# Patient Record
Sex: Male | Born: 1960 | Race: White | Hispanic: No | Smoking: Former smoker
Health system: Southern US, Community
[De-identification: ages and names within clinical notes are randomized; demographics above are authoritative.]

## PROBLEM LIST (undated history)

## (undated) DIAGNOSIS — I10 Essential (primary) hypertension: Secondary | ICD-10-CM

## (undated) DIAGNOSIS — G4730 Sleep apnea, unspecified: Secondary | ICD-10-CM

## (undated) DIAGNOSIS — E785 Hyperlipidemia, unspecified: Secondary | ICD-10-CM

## (undated) DIAGNOSIS — E118 Type 2 diabetes mellitus with unspecified complications: Secondary | ICD-10-CM

## (undated) HISTORY — PX: TONSILLECTOMY: SUR1361

## (undated) HISTORY — DX: Hyperlipidemia, unspecified: E78.5

## (undated) HISTORY — DX: Sleep apnea, unspecified: G47.30

## (undated) HISTORY — DX: Type 2 diabetes mellitus with unspecified complications: E11.8

## (undated) HISTORY — PX: HERNIA REPAIR: SHX51

---

## 2015-09-20 ENCOUNTER — Encounter: Payer: Self-pay | Admitting: Family Medicine

## 2015-09-20 ENCOUNTER — Ambulatory Visit (INDEPENDENT_AMBULATORY_CARE_PROVIDER_SITE_OTHER): Payer: TRICARE For Life (TFL) | Admitting: Family Medicine

## 2015-09-20 VITALS — BP 136/84 | HR 68 | Temp 98.3°F | Resp 18 | Ht 71.0 in | Wt 244.0 lb

## 2015-09-20 DIAGNOSIS — Z0000 Encounter for general adult medical examination without abnormal findings: Secondary | ICD-10-CM

## 2015-09-20 DIAGNOSIS — Z1159 Encounter for screening for other viral diseases: Secondary | ICD-10-CM

## 2015-09-20 DIAGNOSIS — Z7189 Other specified counseling: Secondary | ICD-10-CM

## 2015-09-20 DIAGNOSIS — Z7689 Persons encountering health services in other specified circumstances: Secondary | ICD-10-CM

## 2015-09-20 DIAGNOSIS — Z1211 Encounter for screening for malignant neoplasm of colon: Secondary | ICD-10-CM

## 2015-09-20 NOTE — Progress Notes (Signed)
Subjective:    Patient ID: Willie Coleman, male    DOB: 1960/10/10, 55 y.o.   MRN: [id number]  HPI Since here today to establish care. He has no medical concerns. He is due for rectal exam, PSA, colonoscopy, and hepatitis C screening. Otherwise he is doing well with no concerns. He has been exercising and has lost 18 pounds over last few months. He is trying to lose even more. He is checking his blood pressure at home. His blood pressure tends to run between 120 and 130/70-80. His review of systems is negative. No past medical history on file. Past Surgical History:  Procedure Laterality Date  . HERNIA REPAIR     inguinal  . TONSILLECTOMY     No current outpatient prescriptions on file prior to visit.   No current facility-administered medications on file prior to visit.    No Known Allergies Social History   Social History  . Marital status: Divorced    Spouse name: N/A  . Number of children: N/A  . Years of education: N/A   Occupational History  . Not on file.   Social History Main Topics  . Smoking status: Former Smoker    Years: 30.00    Types: Cigarettes    Quit date: 09/19/2012  . Smokeless tobacco: Never Used  . Alcohol use Yes     Comment: 2 drinks per week  . Drug use: No  . Sexual activity: Not on file     Comment: adopted   Other Topics Concern  . Not on file   Social History Narrative  . No narrative on file   No family history on file. Family history is unknown. He is adopted and has no knowledge of his biologic parents   Review of Systems  All other systems reviewed and are negative.      Objective:   Physical Exam  Constitutional: He is oriented to person, place, and time. He appears well-developed and well-nourished. No distress.  HENT:  Head: Normocephalic and atraumatic.  Right Ear: External ear normal.  Left Ear: External ear normal.  Nose: Nose normal.  Mouth/Throat: Oropharynx is clear and moist. No oropharyngeal exudate.  Eyes:  Conjunctivae and EOM are normal. Pupils are equal, round, and reactive to light. Right eye exhibits no discharge. Left eye exhibits no discharge. No scleral icterus.  Neck: Normal range of motion. Neck supple. No JVD present. No tracheal deviation present. No thyromegaly present.  Cardiovascular: Normal rate, regular rhythm, normal heart sounds and intact distal pulses.  Exam reveals no gallop and no friction rub.   No murmur heard. Pulmonary/Chest: Effort normal and breath sounds normal. No stridor. No respiratory distress. He has no wheezes. He has no rales. He exhibits no tenderness.  Abdominal: Soft. Bowel sounds are normal. He exhibits no distension. There is no tenderness. There is no rebound and no guarding.  Genitourinary: Rectum normal and prostate normal.  Musculoskeletal: Normal range of motion. He exhibits no edema, tenderness or deformity.  Lymphadenopathy:    He has no cervical adenopathy.  Neurological: He is alert and oriented to person, place, and time. He has normal reflexes. He displays normal reflexes. No cranial nerve deficit. He exhibits normal muscle tone. Coordination normal.  Skin: Skin is warm. No rash noted. He is not diaphoretic. No erythema. No pallor.  Psychiatric: He has a normal mood and affect. His behavior is normal. Judgment and thought content normal.  Vitals reviewed.         Assessment &  Plan:  Encounter to establish care with new doctor  Routine general medical examination at a health care facility - Plan: CBC with Differential/Platelet, COMPLETE METABOLIC PANEL WITH GFR, Lipid panel, PSA, Hepatitis C Ab Reflex HCV RNA, QUANT  Need for hepatitis C screening test - Plan: Hepatitis C Ab Reflex HCV RNA, QUANT  Colon cancer screening - Plan: Ambulatory referral to Gastroenterology  Medical exam is normal. I'll have the patient return fasting for fasting lab work including a CBC CMP fasting lipid panel and a PSA along with hepatitis C screening. I'll  schedule him for colonoscopy. Tetanus shots up-to-date. The remainder of his preventative care is up-to-date

## 2015-09-26 ENCOUNTER — Other Ambulatory Visit: Payer: TRICARE For Life (TFL)

## 2015-09-26 LAB — CBC WITH DIFFERENTIAL/PLATELET
BASOS ABS: 0 {cells}/uL (ref 0–200)
BASOS PCT: 0 %
EOS ABS: 434 {cells}/uL (ref 15–500)
Eosinophils Relative: 7 %
HCT: 40.8 % (ref 38.5–50.0)
HEMOGLOBIN: 14.3 g/dL (ref 13.0–17.0)
Lymphocytes Relative: 46 %
Lymphs Abs: 2852 cells/uL (ref 850–3900)
MCH: 32.2 pg (ref 27.0–33.0)
MCHC: 35 g/dL (ref 32.0–36.0)
MCV: 91.9 fL (ref 80.0–100.0)
MONO ABS: 372 {cells}/uL (ref 200–950)
MONOS PCT: 6 %
MPV: 9.8 fL (ref 7.5–12.5)
NEUTROS ABS: 2542 {cells}/uL (ref 1500–7800)
Neutrophils Relative %: 41 %
Platelets: 206 10*3/uL (ref 140–400)
RBC: 4.44 MIL/uL (ref 4.20–5.80)
RDW: 11.9 % (ref 11.0–15.0)
WBC: 6.2 10*3/uL (ref 3.8–10.8)

## 2015-09-26 LAB — LIPID PANEL
Cholesterol: 157 mg/dL (ref 125–200)
HDL: 38 mg/dL — ABNORMAL LOW (ref >=40)
LDL CALC: 103 mg/dL (ref <130)
Total CHOL/HDL Ratio: 4.1 Ratio (ref <=5.0)
Triglycerides: 78 mg/dL (ref <150)
VLDL: 16 mg/dL (ref <30)

## 2015-09-26 LAB — COMPLETE METABOLIC PANEL WITH GFR
ALBUMIN: 4.2 g/dL (ref 3.6–5.1)
ALK PHOS: 58 U/L (ref 40–115)
ALT: 25 U/L (ref 9–46)
AST: 21 U/L (ref 10–35)
BILIRUBIN TOTAL: 0.4 mg/dL (ref 0.2–1.2)
BUN: 22 mg/dL (ref 7–25)
CO2: 26 mmol/L (ref 20–31)
Calcium: 9.4 mg/dL (ref 8.6–10.3)
Chloride: 105 mmol/L (ref 98–110)
Creat: 1.22 mg/dL (ref 0.70–1.33)
GFR, Est African American: 78 mL/min (ref >=60)
GFR, Est Non African American: 67 mL/min (ref >=60)
GLUCOSE: 107 mg/dL — AB (ref 70–99)
Potassium: 4.6 mmol/L (ref 3.5–5.3)
SODIUM: 139 mmol/L (ref 135–146)
TOTAL PROTEIN: 6.6 g/dL (ref 6.1–8.1)

## 2015-09-26 LAB — PSA: PSA: 1.1 ng/mL (ref <=4.0)

## 2015-09-26 LAB — HEPATITIS C ANTIBODY: HCV AB: NEGATIVE

## 2015-09-27 ENCOUNTER — Encounter: Payer: Self-pay | Admitting: *Deleted

## 2015-09-28 ENCOUNTER — Encounter (INDEPENDENT_AMBULATORY_CARE_PROVIDER_SITE_OTHER): Payer: Self-pay | Admitting: *Deleted

## 2015-10-11 ENCOUNTER — Encounter: Payer: Self-pay | Admitting: Family Medicine

## 2015-10-11 ENCOUNTER — Ambulatory Visit (INDEPENDENT_AMBULATORY_CARE_PROVIDER_SITE_OTHER): Payer: TRICARE For Life (TFL) | Admitting: Family Medicine

## 2015-10-11 VITALS — BP 132/78 | HR 76 | Temp 97.7°F | Resp 16 | Ht 71.0 in | Wt 243.0 lb

## 2015-10-11 DIAGNOSIS — F418 Other specified anxiety disorders: Secondary | ICD-10-CM

## 2015-10-11 MED ORDER — PROPRANOLOL HCL 10 MG PO TABS: 10.0000 mg | ORAL_TABLET | Freq: Three times a day (TID) | ORAL | 0 refills | Status: DC

## 2015-10-11 NOTE — Progress Notes (Signed)
   Subjective:    Patient ID: Willie Coleman, male    DOB: [birth year], 55 y.o.   MRN: [id number]  HPI  He has situational anxiety. Whenever he takes a test, has to give a major presentation, he has increased sympathetic response which is characterized by tachycardia, a hand tremor, his voice trembling. His awareness of the symptoms makes them worse. The symptoms then seemed to spiral out of control and impaired his ability to perform on test and presentations. He is interested in medication to help control this. No past medical history on file. Past Surgical History:  Procedure Laterality Date  . HERNIA REPAIR     inguinal  . TONSILLECTOMY     No current outpatient prescriptions on file prior to visit.   No current facility-administered medications on file prior to visit.    No Known Allergies Social History   Social History  . Marital status: Divorced    Spouse name: N/A  . Number of children: N/A  . Years of education: N/A   Occupational History  . Not on file.   Social History Main Topics  . Smoking status: Former Smoker    Years: 30.00    Types: Cigarettes    Quit date: 09/19/2012  . Smokeless tobacco: Never Used  . Alcohol use Yes     Comment: 2 drinks per week  . Drug use: No  . Sexual activity: Not on file     Comment: adopted   Other Topics Concern  . Not on file   Social History Narrative  . No narrative on file       Review of Systems  All other systems reviewed and are negative.      Objective:   Physical Exam  Constitutional: He is oriented to person, place, and time.  Cardiovascular: Normal rate, regular rhythm and normal heart sounds.   Pulmonary/Chest: Effort normal and breath sounds normal.  Neurological: He is alert and oriented to person, place, and time. He has normal reflexes. He displays normal reflexes. No cranial nerve deficit. He exhibits normal muscle tone. Coordination normal.  Psychiatric: He has a normal mood and affect. His  behavior is normal. Judgment and thought content normal.  Vitals reviewed.         Assessment & Plan:  Situational anxiety - Plan: propranolol (INDERAL) 10 MG tablet  Try propranolol 10 mg 1 by mouth every 8 hours when necessary anxiety. The patient will use it 1 hour prior to test or public speaking. Consider cognitive behavioral therapy/biofeedback if medication is not helpful

## 2015-11-08 ENCOUNTER — Other Ambulatory Visit: Payer: Self-pay | Admitting: Family Medicine

## 2015-11-08 DIAGNOSIS — F418 Other specified anxiety disorders: Secondary | ICD-10-CM

## 2016-05-03 ENCOUNTER — Other Ambulatory Visit (INDEPENDENT_AMBULATORY_CARE_PROVIDER_SITE_OTHER): Payer: Self-pay | Admitting: *Deleted

## 2016-05-03 DIAGNOSIS — Z1211 Encounter for screening for malignant neoplasm of colon: Secondary | ICD-10-CM

## 2016-07-31 ENCOUNTER — Telehealth (INDEPENDENT_AMBULATORY_CARE_PROVIDER_SITE_OTHER): Payer: Self-pay | Admitting: *Deleted

## 2016-07-31 ENCOUNTER — Encounter (INDEPENDENT_AMBULATORY_CARE_PROVIDER_SITE_OTHER): Payer: Self-pay | Admitting: *Deleted

## 2016-07-31 DIAGNOSIS — Z1211 Encounter for screening for malignant neoplasm of colon: Secondary | ICD-10-CM

## 2016-07-31 MED ORDER — PEG 3350-KCL-NA BICARB-NACL 420 G PO SOLR: 4000.0000 mL | Freq: Once | ORAL | 0 refills | Status: AC

## 2016-07-31 NOTE — Telephone Encounter (Signed)
Patient needs trilyte 

## 2016-07-31 NOTE — Addendum Note (Signed)
Addended by: Butch Penny on: 07/31/2016 09:22 AM   Modules accepted: Orders

## 2016-07-31 NOTE — Telephone Encounter (Signed)
Referring MD/PCP: pickard   Procedure: tcs  Reason/Indication: screening  Has patient had this procedure before?  no  If so, when, by whom and where?    Is there a family history of colon cancer?  no  Who?  What age when diagnosed?    Is patient diabetic?   no      Does patient have prosthetic heart valve or mechanical valve?  no  Do you have a pacemaker?  no  Has patient ever had endocarditis? no  Has patient had joint replacement within last 12 months?  no  Does patient tend to be constipated or take laxatives? no  Does patient have a history of alcohol/drug use?  no  Is patient on Coumadin, Plavix and/or Aspirin? no  Medications: none  Allergies: nkda  Medication Adjustment per Dr Laural Golden:   Procedure date & time: 08/29/16 at 830

## 2016-08-02 NOTE — Telephone Encounter (Signed)
agree

## 2016-08-13 ENCOUNTER — Ambulatory Visit (INDEPENDENT_AMBULATORY_CARE_PROVIDER_SITE_OTHER): Payer: TRICARE For Life (TFL)

## 2016-08-13 ENCOUNTER — Ambulatory Visit: Payer: TRICARE For Life (TFL)

## 2016-08-13 ENCOUNTER — Ambulatory Visit (INDEPENDENT_AMBULATORY_CARE_PROVIDER_SITE_OTHER): Payer: TRICARE For Life (TFL) | Admitting: Podiatry

## 2016-08-13 ENCOUNTER — Encounter: Payer: Self-pay | Admitting: Podiatry

## 2016-08-13 DIAGNOSIS — Z79899 Other long term (current) drug therapy: Secondary | ICD-10-CM

## 2016-08-13 DIAGNOSIS — R52 Pain, unspecified: Secondary | ICD-10-CM

## 2016-08-13 DIAGNOSIS — B351 Tinea unguium: Secondary | ICD-10-CM

## 2016-08-13 DIAGNOSIS — M79672 Pain in left foot: Secondary | ICD-10-CM

## 2016-08-13 DIAGNOSIS — M779 Enthesopathy, unspecified: Secondary | ICD-10-CM

## 2016-08-13 LAB — HEPATIC FUNCTION PANEL
ALK PHOS: 70 U/L (ref 40–115)
ALT: 22 U/L (ref 9–46)
AST: 23 U/L (ref 10–35)
Albumin: 4.6 g/dL (ref 3.6–5.1)
BILIRUBIN DIRECT: 0.1 mg/dL (ref <=0.2)
BILIRUBIN INDIRECT: 0.5 mg/dL (ref 0.2–1.2)
Total Bilirubin: 0.6 mg/dL (ref 0.2–1.2)
Total Protein: 7.2 g/dL (ref 6.1–8.1)

## 2016-08-13 LAB — CBC WITH DIFFERENTIAL/PLATELET
BASOS PCT: 1 %
Basophils Absolute: 76 cells/uL (ref 0–200)
EOS ABS: 304 {cells}/uL (ref 15–500)
Eosinophils Relative: 4 %
HEMATOCRIT: 43 % (ref 38.5–50.0)
HEMOGLOBIN: 15.1 g/dL (ref 13.2–17.1)
LYMPHS ABS: 2812 {cells}/uL (ref 850–3900)
LYMPHS PCT: 37 %
MCH: 32.2 pg (ref 27.0–33.0)
MCHC: 35.1 g/dL (ref 32.0–36.0)
MCV: 91.7 fL (ref 80.0–100.0)
MONO ABS: 380 {cells}/uL (ref 200–950)
MPV: 10.3 fL (ref 7.5–12.5)
Monocytes Relative: 5 %
NEUTROS ABS: 4028 {cells}/uL (ref 1500–7800)
Neutrophils Relative %: 53 %
Platelets: 230 10*3/uL (ref 140–400)
RBC: 4.69 MIL/uL (ref 4.20–5.80)
RDW: 12.9 % (ref 11.0–15.0)
WBC: 7.6 10*3/uL (ref 3.8–10.8)

## 2016-08-13 MED ORDER — MELOXICAM 15 MG PO TABS: 15.0000 mg | ORAL_TABLET | Freq: Every day | ORAL | 2 refills | Status: DC

## 2016-08-13 MED ORDER — TERBINAFINE HCL 250 MG PO TABS: 250.0000 mg | ORAL_TABLET | Freq: Every day | ORAL | 0 refills | Status: DC

## 2016-08-13 NOTE — Patient Instructions (Signed)
Peroneal Tendinopathy Rehab Ask your health care provider which exercises are safe for you. Do exercises exactly as told by your health care provider and adjust them as directed. It is normal to feel mild stretching, pulling, tightness, or discomfort as you do these exercises, but you should stop right away if you feel sudden pain or your pain gets worse.Do not begin these exercises until told by your health care provider. Stretching and range of motion exercises These exercises warm up your muscles and joints and improve the movement and flexibility of your ankle. These exercises also help to relieve pain and stiffness. Exercise A: Gastroc and soleus, standing 1. Stand on the edge of a step on the balls of your feet. The ball of your foot is on the walking surface, right under your toes. 2. Hold onto the railing for balance. 3. Slowly lift your left / right foot, allowing your body weight to press your left / right heel down over the edge of the step. You should feel a stretch in your left / right calf. 4. Hold this position for __________ seconds. Repeat __________ times with your left / right knee straight and __________ times with your left / right knee bent. Complete this stretch __________ times per day. Strengthening exercises These exercises improve the strength and endurance of your foot and ankle. Endurance is the ability to use your muscles for a long time, even after they get tired. Exercise B: Dorsiflexors  1. Secure a rubber exercise band or tube to an object, like a table leg, that will not move if it is pulled on. 2. Secure the other end of the band around your left / right foot. 3. Sit on the floor, facing the object with your left / right foot extended. The band or tube should be slightly tense when your foot is relaxed. 4. Slowly flex your left / right ankle and toes to bring your foot toward you. 5. Hold this position for __________ seconds. 6. Slowly return your foot to the  starting position. Repeat __________ times. Complete this exercise __________ times per day. Exercise C: Evertors 1. Sit on the floor with your legs straight out in front of you. 2. Loop a rubber exercise or band or tube around the ball of your left / right foot. The ball of your foot is on the walking surface, right under your toes. 3. Hold the ends of the band in your hands, or secure the band to a stable object. 4. Slowly push your foot outward, away from your other leg. 5. Hold this position for __________ seconds. 6. Slowly return your foot to the starting position. Repeat __________ times. Complete this exercise __________ times per day. Exercise D: Standing heel raise ( plantar flexion) 1. Stand with your feet shoulder-width apart with the balls of your feet on a step. The ball of your foot is on the walking surface, right under your toes. 2. Keep your weight spread evenly over the width of your feet while you rise up on your toes. Use a wall or railing to steady yourself, but try not to use it for support. 3. If this exercise is too easy, try these options: ? Shift your weight toward your left / right leg until you feel challenged. ? If told by your health care provider, stand on your left / right leg only. 4. Hold this position for __________ seconds. Repeat __________ times. Complete this exercise __________ times per day. Exercise E: Single leg stand 1.   Without shoes, stand near a railing or in a doorway. You may hold onto the railing or door frame as needed. 2. Stand on your left / right foot. Keep your big toe down on the floor and try to keep your arch lifted. ? Do not roll to the outside of your foot. ? If this exercise is too easy, you can try it with your eyes closed or while standing on a pillow. 3. Hold this position for __________ seconds. Repeat __________ times. Complete this exercise __________ times per day. This information is not intended to replace advice given to  you by your health care provider. Make sure you discuss any questions you have with your health care provider. Document Released: 01/15/2005 Document Revised: 09/22/2015 Document Reviewed: 12/04/2014 Elsevier Interactive Patient Education  2018 [employer]   Terbinafine oral granules What is this medicine? TERBINAFINE (TER bin a feen) is an antifungal medicine. It is used to treat certain kinds of fungal or yeast infections. This medicine may be used for other purposes; ask your health care provider or pharmacist if you have questions. COMMON BRAND NAME(S): Lamisil What should I tell my health care provider before I take this medicine? They need to know if you have any of these conditions: -drink alcoholic beverages -kidney disease -liver disease -an unusual or allergic reaction to Terbinafine, other medicines, foods, dyes, or preservatives -pregnant or trying to get pregnant -breast-feeding How should I use this medicine? Take this medicine by mouth. Follow the directions on the prescription label. Hold packet with cut line on top. Shake packet gently to settle contents. Tear packet open along cut line, or use scissors to cut across line. Carefully pour the entire contents of packet onto a spoonful of a soft food, such as pudding or other soft, non-acidic food such as mashed potatoes (do NOT use applesauce or a fruit-based food). If two packets are required for each dose, you may either sprinkle the content of both packets on one spoonful of non-acidic food, or sprinkle the contents of both packets on two spoonfuls of non-acidic food. Make sure that no granules remain in the packet. Swallow the mxiture of the food and granules without chewing. Take your medicine at regular intervals. Do not take it more often than directed. Take all of your medicine as directed even if you think you are better. Do not skip doses or stop your medicine early. Contact your pediatrician or health care  professional regarding the use of this medicine in children. While this medicine may be prescribed for children as young as 4 years for selected conditions, precautions do apply. Overdosage: If you think you have taken too much of this medicine contact a poison control center or emergency room at once. NOTE: This medicine is only for you. Do not share this medicine with others. What if I miss a dose? If you miss a dose, take it as soon as you can. If it is almost time for your next dose, take only that dose. Do not take double or extra doses. What may interact with this medicine? Do not take this medicine with any of the following medications: -thioridazine This medicine may also interact with the following medications: -beta-blockers -caffeine -cimetidine -cyclosporine -MAOIs like Carbex, Eldepryl, Marplan, Nardil, and Parnate -medicines for fungal infections like fluconazole and ketoconazole -medicines for irregular heartbeat like amiodarone, flecainide and propafenone -rifampin -SSRIs like citalopram, escitalopram, fluoxetine, fluvoxamine, paroxetine and sertraline -tricyclic antidepressants like amitriptyline, clomipramine, desipramine, imipramine, nortriptyline, and others -warfarin This   list may not describe all possible interactions. Give your health care provider a list of all the medicines, herbs, non-prescription drugs, or dietary supplements you use. Also tell them if you smoke, drink alcohol, or use illegal drugs. Some items may interact with your medicine. What should I watch for while using this medicine? Your doctor may monitor your liver function. Tell your doctor right away if you have nausea or vomiting, loss of appetite, stomach pain on your right upper side, yellow skin, dark urine, light stools, or are over tired. You need to take this medicine for 6 weeks or longer to cure the fungal infection. Take your medicine regularly for as long as your doctor or health care  professional tells you to. What side effects may I notice from receiving this medicine? Side effects that you should report to your doctor or health care professional as soon as possible: -allergic reactions like skin rash or hives, swelling of the face, lips, or tongue -change in vision -dark urine -fever or infection -general ill feeling or flu-like symptoms -light-colored stools -loss of appetite, nausea -redness, blistering, peeling or loosening of the skin, including inside the mouth -right upper belly pain -unusually weak or tired -yellowing of the eyes or skin Side effects that usually do not require medical attention (report to your doctor or health care professional if they continue or are bothersome): -changes in taste -diarrhea -hair loss -muscle or joint pain -stomach upset This list may not describe all possible side effects. Call your doctor for medical advice about side effects. You may report side effects to FDA at 1-800-FDA-1088. Where should I keep my medicine? Keep out of the reach of children. Store at room temperature between 15 and 30 degrees C (59 and 86 degrees F). Throw away any unused medicine after the expiration date. NOTE: This sheet is a summary. It may not cover all possible information. If you have questions about this medicine, talk to your doctor, pharmacist, or health care provider.  2018 Elsevier/Gold Standard (2007-03-28 17:25:48)  

## 2016-08-13 NOTE — Progress Notes (Signed)
   Subjective:    Patient ID: Rachel Bo, male    DOB: 1960/05/18, 56 y.o.   MRN: [id number]  HPI  56 year old male presents the office with concerns of left foot pain. Since he gets pain inside of his left foot he points the fifth metatarsal base where he gets symptoms in ongoing for about 6 months to 1 year. He states the pain is intermittent in nature mostly after running or doing a lot of walking. Better with rest. He said no significant treatment to this. No recent injury or trauma. No numbness or tingling. He states his nails are also discolored and thick. This has been ongoing for several years. No recent treatment. No redness or drainage or any swelling to the toenail sites. He has no other concerns.  Review of Systems  All other systems reviewed and are negative.      Objective:   Physical Exam General: AAO x3, NAD  Dermatological: Nails are hypertrophic, dystrophic, brittle, discolored, elongated 10. No surrounding redness or drainage. No open lesions or pre-ulcerative lesions are identified today.  Vascular: Dorsalis Pedis artery and Posterior Tibial artery pedal pulses are 2/4 bilateral with immedate capillary fill time. Pedal hair growth present.. There is no pain with calf compression, swelling, warmth, erythema.    Neruologic: Grossly intact via light touch bilateral. Vibratory intact via tuning fork bilateral. Protective threshold with Semmes Wienstein monofilament intact to all pedal sites bilateral.   Musculoskeletal: There is tenderness palpation along the course of the peroneal tendon on the insertion of the fifth metatarsal base and left foot. Tendon appears to be intact. There is localized edema is no erythema or increase in warmth. No area pinpoint bony tenderness or pain the vibratory sensation. Muscular strength 5/5 in all groups tested bilateral.  Gait: Unassisted, Nonantalgic.      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 No evidence of acute fracture identified. -Recommended insert for shoes. Discussed custom orthotics. He was molded for them but then later on. At that his insurance does not cover them he wishes to hold off. Stretching of her rehabilitation exercises. Vibratory is needed. -Discussed treatment options for nail fungus. After discussion he elected to proceed with oral Lamisil. Discussed side effects the medication. I went ahead and prescribed a also ordered blood work and he is not to start the blood work until I call the results of the blood work and he verbally understood this.  -Follow-up as scheduled or sooner if needed.  Celesta Gentile, DPM

## 2016-08-14 ENCOUNTER — Telehealth: Payer: Self-pay | Admitting: *Deleted

## 2016-08-14 NOTE — Telephone Encounter (Addendum)
-----   Message from Trula Slade, DPM sent at 08/14/2016  7:19 AM EDT ----- Blood work norma- OK to start Lamisil- please let him know. Thank you. I informed pt of Dr. Leigh Aurora review of results and orders.

## 2016-08-29 ENCOUNTER — Ambulatory Visit (HOSPITAL_COMMUNITY)
Admission: RE | Admit: 2016-08-29 | Discharge: 2016-08-29 | Disposition: A | Discharge status: Home / Self Care | Payer: TRICARE For Life (TFL) | Source: Ambulatory Visit | Attending: Internal Medicine | Admitting: Internal Medicine

## 2016-08-29 ENCOUNTER — Encounter (HOSPITAL_COMMUNITY)
Admission: RE | Disposition: A | Discharge status: Home / Self Care | Payer: Self-pay | Source: Ambulatory Visit | Attending: Internal Medicine

## 2016-08-29 ENCOUNTER — Encounter (HOSPITAL_COMMUNITY): Payer: Self-pay

## 2016-08-29 DIAGNOSIS — F419 Anxiety disorder, unspecified: Secondary | ICD-10-CM | POA: Diagnosis not present

## 2016-08-29 DIAGNOSIS — Z1211 Encounter for screening for malignant neoplasm of colon: Secondary | ICD-10-CM | POA: Insufficient documentation

## 2016-08-29 DIAGNOSIS — Z87891 Personal history of nicotine dependence: Secondary | ICD-10-CM | POA: Insufficient documentation

## 2016-08-29 DIAGNOSIS — Z79899 Other long term (current) drug therapy: Secondary | ICD-10-CM | POA: Diagnosis not present

## 2016-08-29 DIAGNOSIS — D175 Benign lipomatous neoplasm of intra-abdominal organs: Secondary | ICD-10-CM | POA: Insufficient documentation

## 2016-08-29 HISTORY — PX: COLONOSCOPY: SHX5424

## 2016-08-29 SURGERY — COLONOSCOPY
Anesthesia: Moderate Sedation

## 2016-08-29 MED ORDER — MIDAZOLAM HCL 5 MG/5ML IJ SOLN
INTRAMUSCULAR | Status: DC | PRN
  Administered 2016-08-29 (×2): 2 mg via INTRAVENOUS
  Administered 2016-08-29: 1 mg via INTRAVENOUS
  Administered 2016-08-29: 2 mg via INTRAVENOUS

## 2016-08-29 MED ORDER — SODIUM CHLORIDE 0.9 % IV SOLN
INTRAVENOUS | Status: DC
  Administered 2016-08-29: 08:00:00 via INTRAVENOUS

## 2016-08-29 MED ORDER — STERILE WATER FOR IRRIGATION IR SOLN
Status: DC | PRN
  Administered 2016-08-29: 09:00:00

## 2016-08-29 MED ORDER — MEPERIDINE HCL 50 MG/ML IJ SOLN
INTRAMUSCULAR | Status: DC | PRN
  Administered 2016-08-29 (×2): 25 mg via INTRAVENOUS

## 2016-08-29 MED ORDER — MEPERIDINE HCL 50 MG/ML IJ SOLN
INTRAMUSCULAR | Status: AC
  Filled 2016-08-29: qty 1

## 2016-08-29 MED ORDER — MIDAZOLAM HCL 5 MG/5ML IJ SOLN
INTRAMUSCULAR | Status: AC
  Filled 2016-08-29: qty 10

## 2016-08-29 NOTE — Op Note (Signed)
[facility] Patient Name: Willie Coleman Procedure Date: 08/29/2016 8:13 AM MRN: [id number] Date of Birth: Jul 10, 1960 Attending MD: Hildred Laser , MD CSN: [id number] Age: 56 Admit Type: Outpatient Procedure:                Colonoscopy Indications:              Screening for colorectal malignant neoplasm Providers:                Hildred Laser, MD, Charlyne Petrin RN, RN, Hinton Rao, RN Referring MD:             Cammie Mcgee. Pickard, MD Medicines:                Meperidine 50 mg IV, Midazolam 7 mg IV Complications:            No immediate complications. Estimated Blood Loss:     Estimated blood loss: none. Procedure:                Pre-Anesthesia Assessment:                           - Prior to the procedure, a History and Physical                            was performed, and patient medications and                            allergies were reviewed. The patient's tolerance of                            previous anesthesia was also reviewed. The risks                            and benefits of the procedure and the sedation                            options and risks were discussed with the patient.                            All questions were answered, and informed consent                            was obtained. Prior Anticoagulants: The patient                            last took ibuprofen 7 days prior to the procedure.                            ASA Grade Assessment: I - A normal, healthy                            patient. After reviewing the risks and benefits,  the patient was deemed in satisfactory condition to                            undergo the procedure.                           After obtaining informed consent, the colonoscope                            was passed under direct vision. Throughout the                            procedure, the patient's blood pressure, pulse, and                             oxygen saturations were monitored continuously. The                            [id number] ([id number]) scope was introduced through                            the anus and advanced to the the cecum, identified                            by appendiceal orifice and ileocecal valve. The                            colonoscopy was performed without difficulty. The                            patient tolerated the procedure well. The quality                            of the bowel preparation was good. The ileocecal                            valve, appendiceal orifice, and rectum were                            photographed. Scope In: 8:45:06 AM Scope Out: 9:06:32 AM Scope Withdrawal Time: 0 hours 13 minutes 6 seconds  Total Procedure Duration: 0 hours 21 minutes 26 seconds  Findings:      The perianal and digital rectal examinations were normal.      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      The exam was otherwise without abnormality.      The retroflexed view of the distal rectum and anal verge was normal and       showed no anal or rectal abnormalities. 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                           - The examination was otherwise normal.                           -  No specimens collected. Moderate Sedation:      Moderate (conscious) sedation was administered by the endoscopy nurse       and supervised by the endoscopist. The following parameters were       monitored: oxygen saturation, heart rate, blood pressure, CO2       capnography and response to care. Total physician intraservice time was       27 minutes. Recommendation:           - Patient has a contact number available for                            emergencies. The signs and symptoms of potential                            delayed complications were discussed with the                            patient. Return to normal activities tomorrow.                             Written discharge instructions were provided to the                            patient.                           - Resume previous diet today.                           - Continue present medications.                           - Repeat colonoscopy in 10 years for screening                            purposes. -                           - Abdominopelvic CT prior to considering endoscopic                            polypectomy of lipoma.                           comment: Patient is at risk for intussusception.                            Therefore endoscopic polypectomy would be                            considered after CT obtained. Procedure Code(s):        --- Professional ---                           [phone], Colonoscopy, flexible; diagnostic, including  collection of specimen(s) by brushing or washing,                            when performed (separate procedure)                           99152, Moderate sedation services provided by the                            same physician or other qualified health care                            professional performing the diagnostic or                            therapeutic service that the sedation supports,                            requiring the presence of an independent trained                            observer to assist in the monitoring of the                            patient's level of consciousness and physiological                            status; initial 15 minutes of intraservice time,                            patient age 58 years or older                           [phone], Moderate sedation services; each additional                            15 minutes intraservice time Diagnosis Code(s):        --- Professional ---                           Z12.11, Encounter for screening for malignant                            neoplasm of colon                           D17.5, Benign lipomatous neoplasm of                             intra-abdominal organs CPT copyright 2016 [employer]. All rights reserved. The codes documented in this report are preliminary and upon coder review may  be revised to meet current compliance requirements. Hildred Laser, MD Hildred Laser, MD 08/29/2016 9:15:22 AM This report has been signed electronically. Number of Addenda: 0

## 2016-08-29 NOTE — Discharge Instructions (Signed)
° °  Colonoscopy, Adult, Care After This sheet gives you information about how to care for yourself after your procedure. Your health care provider may also give you more specific instructions. If you have problems or questions, contact your health care provider. What can I expect after the procedure? After the procedure, it is common to have:  A small amount of blood in your stool for 24 hours after the procedure.  Some gas.  Mild abdominal cramping or bloating.  Follow these instructions at home: General instructions   For the first 24 hours after the procedure: ? Do not drive or use machinery. ? Do not sign important documents. ? Do not drink alcohol. ? Do your regular daily activities at a slower pace than normal. ? Eat soft, easy-to-digest foods. ? Rest often.  Take over-the-counter or prescription medicines only as told by your health care provider.  It is up to you to get the results of your procedure. Ask your health care provider, or the department performing the procedure, when your results will be ready. Relieving cramping and bloating  Try walking around when you have cramps or feel bloated.  Apply heat to your abdomen as told by your health care provider. Use a heat source that your health care provider recommends, such as a moist heat pack or a heating pad. ? Place a towel between your skin and the heat source. ? Leave the heat on for 20-30 minutes. ? Remove the heat if your skin turns bright red. This is especially important if you are unable to feel pain, heat, or cold. You may have a greater risk of getting burned. Eating and drinking  Drink enough fluid to keep your urine clear or pale yellow.  Resume your normal diet as instructed by your health care provider. Avoid heavy or fried foods that are hard to digest.  Avoid drinking alcohol for as long as instructed by your health care provider. Contact a health care provider if:  You have blood in your stool  2-3 days after the procedure. Get help right away if:  You have more than a small spotting of blood in your stool.  You pass large blood clots in your stool.  Your abdomen is swollen.  You have nausea or vomiting.  You have a fever.  You have increasing abdominal pain that is not relieved with medicine. This information is not intended to replace advice given to you by your health care provider. Make sure you discuss any questions you have with your health care provider. Document Released: 08/30/2003 Document Revised: 10/10/2015 Document Reviewed: 03/29/2015 Elsevier Interactive Patient Education  2018 [place] usual medications and diet. No driving for 24 hours. Will schedule abdominopelvic CT with contrast. Office will call.

## 2016-08-29 NOTE — H&P (Signed)
Willie Coleman is an 56 y.o. male.   Chief Complaint: Patient is here for colonoscopy. HPI: Patient is 56 year old Caucasian male who is here for screening colonoscopy. He denies abdominal pain change in bowel habits or rectal bleeding. Family history is not available because he is adopted.  Past medical history: History of foot pain/arthritis. Nail fungus. Anxiety.   Past Surgical History:  Procedure Laterality Date  . HERNIA REPAIR     inguinal  . TONSILLECTOMY      History reviewed. No pertinent family history. Social History:  reports that he quit smoking about 3 years ago. His smoking use included Cigarettes. He quit after 30.00 years of use. He has never used smokeless tobacco. He reports that he drinks alcohol. He reports that he does not use drugs.  Allergies: No Known Allergies  Medications Prior to Admission  Medication Sig Dispense Refill  . ibuprofen (ADVIL,MOTRIN) 200 MG tablet Take 600 mg by mouth every 8 (eight) hours as needed (for pain.).    Marland Kitchen KRILL OIL PO Take 1 capsule by mouth daily.    . meloxicam (MOBIC) 15 MG tablet Take 1 tablet (15 mg total) by mouth daily. (Patient taking differently: Take 15 mg by mouth daily as needed (for foot pain.). ) 30 tablet 2  . Multiple Vitamin (MULTIVITAMIN WITH MINERALS) TABS tablet Take 1 tablet by mouth daily.    . propranolol (INDERAL) 10 MG tablet TAKE 1 TABLET(10 MG) BY MOUTH THREE TIMES DAILY (Patient taking differently: TAKE 1 TABLET(10 MG) BY MOUTH DAILY AS NEEDED FOR ANXIETY) 90 tablet 0  . terbinafine (LAMISIL) 250 MG tablet Take 1 tablet (250 mg total) by mouth daily. 90 tablet 0    No results found for this or any previous visit (from the past 48 hour(s)). No results found.  ROS  Blood pressure (!) 156/93, pulse 65, temperature 98.3 F (36.8 C), temperature source Oral, resp. rate 14, SpO2 100 %. Physical Exam  Constitutional: He appears well-developed and well-nourished.  HENT:  Mouth/Throat: Oropharynx is  clear and moist.  Eyes: Conjunctivae are normal. No scleral icterus.  Neck: No thyromegaly present.  Cardiovascular: Normal rate, regular rhythm and normal heart sounds.   No murmur heard. Respiratory: Effort normal and breath sounds normal.  GI: Soft. He exhibits no distension and no mass. There is no tenderness.  Musculoskeletal: He exhibits no edema.  Lymphadenopathy:    He has no cervical adenopathy.  Neurological: He is alert.  Skin: Skin is warm and dry.     Assessment/Plan Average risk screening colonoscopy.  Hildred Laser, MD 08/29/2016, 8:35 AM

## 2016-08-30 ENCOUNTER — Other Ambulatory Visit (INDEPENDENT_AMBULATORY_CARE_PROVIDER_SITE_OTHER): Payer: Self-pay | Admitting: Internal Medicine

## 2016-08-30 DIAGNOSIS — D179 Benign lipomatous neoplasm, unspecified: Secondary | ICD-10-CM

## 2016-08-31 ENCOUNTER — Encounter (HOSPITAL_COMMUNITY): Payer: Self-pay | Admitting: Internal Medicine

## 2016-09-04 ENCOUNTER — Ambulatory Visit (INDEPENDENT_AMBULATORY_CARE_PROVIDER_SITE_OTHER): Payer: TRICARE For Life (TFL) | Admitting: Family Medicine

## 2016-09-04 ENCOUNTER — Encounter: Payer: Self-pay | Admitting: Family Medicine

## 2016-09-04 VITALS — BP 136/98 | HR 60 | Temp 98.2°F | Resp 14 | Ht 71.0 in | Wt 229.0 lb

## 2016-09-04 DIAGNOSIS — Z0000 Encounter for general adult medical examination without abnormal findings: Secondary | ICD-10-CM | POA: Diagnosis not present

## 2016-09-04 LAB — CBC WITH DIFFERENTIAL/PLATELET
BASOS ABS: 47 {cells}/uL (ref 0–200)
Basophils Relative: 1 %
EOS PCT: 6 %
Eosinophils Absolute: 282 cells/uL (ref 15–500)
HCT: 45 % (ref 38.5–50.0)
Hemoglobin: 15.7 g/dL (ref 13.0–17.0)
LYMPHS PCT: 45 %
Lymphs Abs: 2115 cells/uL (ref 850–3900)
MCH: 32.2 pg (ref 27.0–33.0)
MCHC: 34.9 g/dL (ref 32.0–36.0)
MCV: 92.2 fL (ref 80.0–100.0)
MONOS PCT: 6 %
MPV: 10.1 fL (ref 7.5–12.5)
Monocytes Absolute: 282 cells/uL (ref 200–950)
NEUTROS PCT: 42 %
Neutro Abs: 1974 cells/uL (ref 1500–7800)
PLATELETS: 227 10*3/uL (ref 140–400)
RBC: 4.88 MIL/uL (ref 4.20–5.80)
RDW: 12.9 % (ref 11.0–15.0)
WBC: 4.7 10*3/uL (ref 3.8–10.8)

## 2016-09-04 LAB — LIPID PANEL
CHOL/HDL RATIO: 3.4 ratio (ref <5.0)
Cholesterol: 201 mg/dL — ABNORMAL HIGH (ref <200)
HDL: 59 mg/dL (ref >40)
LDL CALC: 123 mg/dL — AB (ref <100)
TRIGLYCERIDES: 97 mg/dL (ref <150)
VLDL: 19 mg/dL (ref <30)

## 2016-09-04 LAB — COMPLETE METABOLIC PANEL WITH GFR
ALT: 21 U/L (ref 9–46)
AST: 22 U/L (ref 10–35)
Albumin: 5.1 g/dL (ref 3.6–5.1)
Alkaline Phosphatase: 63 U/L (ref 40–115)
BUN: 24 mg/dL (ref 7–25)
CO2: 22 mmol/L (ref 20–32)
Calcium: 10.3 mg/dL (ref 8.6–10.3)
Chloride: 102 mmol/L (ref 98–110)
Creat: 1.21 mg/dL (ref 0.70–1.33)
GFR, EST NON AFRICAN AMERICAN: 67 mL/min (ref >=60)
GFR, Est African American: 78 mL/min (ref >=60)
GLUCOSE: 99 mg/dL (ref 70–99)
POTASSIUM: 5.4 mmol/L — AB (ref 3.5–5.3)
SODIUM: 138 mmol/L (ref 135–146)
Total Bilirubin: 0.9 mg/dL (ref 0.2–1.2)
Total Protein: 7.6 g/dL (ref 6.1–8.1)

## 2016-09-04 NOTE — Progress Notes (Signed)
Subjective:    Patient ID: Willie Coleman, male    DOB: Dec 14, 1960, 56 y.o.   MRN: [id number]  HPI The patient is a very pleasant 56 year old Caucasian male here today for complete physical exam. Recently had a colonoscopy that was significant only for a month. GI has cleared the patient for 10 years. He is due today for PSA. Hepatitis C screening was performed last year and was negative. 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 He requires proof of immunization of MMR, hepatitis B, and parasellar. Unfortunately he has no shot record. Therefore he requires antibiotic titers to confirm immunity. Otherwise he is doing well with no concerns. His blood pressure today is elevated and I confirmed this myself. However he states that he checks his blood pressure daily at home and it's always in the 120s over 70s. This may be an element of white coat syndrome. I have asked him to bring his blood pressure cuff by the office so we can prove its accuracy. If accurate, I would not do anything to control his blood pressure. However today his blood pressures elevated. We did discuss reducing sodium intake however he monitors his diet and almost consumes less than 2000 mg of sodium. He is also exercising. No past medical history on file. Past Surgical History:  Procedure Laterality Date  . COLONOSCOPY N/A 08/29/2016   Procedure: COLONOSCOPY;  Surgeon: Rogene Houston, MD;  Location: [facility];  Service: Endoscopy;  Laterality: N/A;  830  . HERNIA REPAIR     inguinal  . TONSILLECTOMY     Current Outpatient Prescriptions on File Prior to Visit  Medication Sig Dispense Refill  . KRILL OIL PO Take 1 capsule by mouth daily.    . meloxicam (MOBIC) 15 MG tablet Take 1 tablet (15 mg total) by mouth daily. (Patient taking differently: Take 15 mg by mouth daily as needed (for foot pain.). ) 30 tablet 2  . Multiple Vitamin (MULTIVITAMIN WITH MINERALS) TABS tablet Take 1 tablet by mouth daily.    .  propranolol (INDERAL) 10 MG tablet TAKE 1 TABLET(10 MG) BY MOUTH THREE TIMES DAILY (Patient taking differently: TAKE 1 TABLET(10 MG) BY MOUTH DAILY AS NEEDED FOR ANXIETY) 90 tablet 0  . terbinafine (LAMISIL) 250 MG tablet Take 1 tablet (250 mg total) by mouth daily. 90 tablet 0   No current facility-administered medications on file prior to visit.    No Known Allergies Social History   Social History  . Marital status: Divorced    Spouse name: N/A  . Number of children: N/A  . Years of education: N/A   Occupational History  . Not on file.   Social History Main Topics  . Smoking status: Former Smoker    Years: 30.00    Types: Cigarettes    Quit date: 09/19/2012  . Smokeless tobacco: Never Used  . Alcohol use Yes     Comment: 2 drinks per week  . Drug use: No  . Sexual activity: Not on file     Comment: adopted   Other Topics Concern  . Not on file   Social History Narrative  . No narrative on file   No family history on file. Family history is unknown. He is adopted and has no knowledge of his biologic parents   Review of Systems  All other systems reviewed and are negative.      Objective:   Physical Exam  Constitutional: He is oriented to person, place, and  time. He appears well-developed and well-nourished. No distress.  HENT:  Head: Normocephalic and atraumatic.  Right Ear: External ear normal.  Left Ear: External ear normal.  Nose: Nose normal.  Mouth/Throat: Oropharynx is clear and moist. No oropharyngeal exudate.  Eyes: Pupils are equal, round, and reactive to light. Conjunctivae and EOM are normal. Right eye exhibits no discharge. Left eye exhibits no discharge. No scleral icterus.  Neck: Normal range of motion. Neck supple. No JVD present. No tracheal deviation present. No thyromegaly present.  Cardiovascular: Normal rate, regular rhythm, normal heart sounds and intact distal pulses.  Exam reveals no gallop and no friction rub.   No murmur  heard. Pulmonary/Chest: Effort normal and breath sounds normal. No stridor. No respiratory distress. He has no wheezes. He has no rales. He exhibits no tenderness.  Abdominal: Soft. Bowel sounds are normal. He exhibits no distension. There is no tenderness. There is no rebound and no guarding.  Musculoskeletal: Normal range of motion. He exhibits no edema, tenderness or deformity.  Lymphadenopathy:    He has no cervical adenopathy.  Neurological: He is alert and oriented to person, place, and time. He has normal reflexes. No cranial nerve deficit. He exhibits normal muscle tone. Coordination normal.  Skin: Skin is warm. No rash noted. He is not diaphoretic. No erythema. No pallor.  Psychiatric: He has a normal mood and affect. His behavior is normal. Judgment and thought content normal.  Vitals reviewed.         Assessment & Plan:  General medical exam - Plan: CBC with Differential/Platelet, COMPLETE METABOLIC PANEL WITH GFR, Lipid panel, PSA, Measles/Mumps/Rubella Immunity, Varicella zoster antibody, IgG, Hepatitis B surface antibody  Physical exam today is completely normal esophagus blood pressure. I deferred a rectal exam given the fact the patient just had a colonoscopy. I will check a CBC, CMP, fasting lipid panel, and a PSA regarding his general medical surveillance. I recommended that he check the accuracy of his blood pressure cuff against ours. If his blood pressure is proven to be consistently greater than 140/90, I would institute medication to lower his blood pressure. He is presently on Lamisil for onychomycosis. Therefore I will also monitor his liver function test. 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

## 2016-09-05 LAB — PSA: PSA: 1.4 ng/mL (ref <=4.0)

## 2016-09-05 LAB — MEASLES/MUMPS/RUBELLA IMMUNITY
Mumps IgG: 100 AU/mL — ABNORMAL HIGH (ref <9.00)
Rubella: 15.1 Index — ABNORMAL HIGH (ref <0.90)
Rubeola IgG: 133 AU/mL — ABNORMAL HIGH (ref <25.00)

## 2016-09-05 LAB — HEPATITIS B SURFACE ANTIBODY,QUALITATIVE: Hep B S Ab: BORDERLINE — AB

## 2016-09-05 LAB — VARICELLA ZOSTER ANTIBODY, IGG: Varicella IgG: 1169 Index — ABNORMAL HIGH (ref <135.00)

## 2016-09-06 ENCOUNTER — Encounter: Payer: Self-pay | Admitting: Family Medicine

## 2016-09-06 ENCOUNTER — Ambulatory Visit (HOSPITAL_COMMUNITY)
Admission: RE | Admit: 2016-09-06 | Discharge: 2016-09-06 | Disposition: A | Discharge status: Home / Self Care | Payer: TRICARE For Life (TFL) | Source: Ambulatory Visit | Attending: Internal Medicine | Admitting: Internal Medicine

## 2016-09-06 ENCOUNTER — Encounter (HOSPITAL_COMMUNITY): Payer: Self-pay

## 2016-09-06 DIAGNOSIS — I700 Atherosclerosis of aorta: Secondary | ICD-10-CM | POA: Insufficient documentation

## 2016-09-06 DIAGNOSIS — D179 Benign lipomatous neoplasm, unspecified: Secondary | ICD-10-CM | POA: Insufficient documentation

## 2016-09-06 HISTORY — DX: Essential (primary) hypertension: I10

## 2016-09-06 MED ORDER — IOPAMIDOL (ISOVUE-300) INJECTION 61%
100.0000 mL | Freq: Once | INTRAVENOUS | Status: AC | PRN
  Administered 2016-09-06: 100 mL via INTRAVENOUS

## 2016-09-07 ENCOUNTER — Ambulatory Visit (INDEPENDENT_AMBULATORY_CARE_PROVIDER_SITE_OTHER): Payer: TRICARE For Life (TFL)

## 2016-09-07 VITALS — BP 138/90 | HR 63

## 2016-09-07 DIAGNOSIS — Z23 Encounter for immunization: Secondary | ICD-10-CM | POA: Diagnosis not present

## 2016-09-07 DIAGNOSIS — Z0130 Encounter for examination of blood pressure without abnormal findings: Secondary | ICD-10-CM

## 2016-09-07 NOTE — Addendum Note (Signed)
Addended by: Vonna Kotyk A on: 09/07/2016 03:49 PM   Modules accepted: Orders

## 2016-09-10 ENCOUNTER — Encounter: Payer: Self-pay | Admitting: Family Medicine

## 2016-09-10 ENCOUNTER — Ambulatory Visit: Payer: TRICARE For Life (TFL) | Admitting: Family Medicine

## 2016-09-10 VITALS — BP 118/72 | HR 66

## 2016-09-10 DIAGNOSIS — Z0130 Encounter for examination of blood pressure without abnormal findings: Secondary | ICD-10-CM

## 2016-09-21 NOTE — Progress Notes (Signed)
Sent a staff message to Leonia Reeves to find a place to bring this patient in in December.

## 2016-09-24 ENCOUNTER — Ambulatory Visit (INDEPENDENT_AMBULATORY_CARE_PROVIDER_SITE_OTHER): Payer: TRICARE For Life (TFL) | Admitting: Podiatry

## 2016-09-24 ENCOUNTER — Encounter: Payer: Self-pay | Admitting: Podiatry

## 2016-09-24 DIAGNOSIS — M779 Enthesopathy, unspecified: Secondary | ICD-10-CM | POA: Diagnosis not present

## 2016-09-24 DIAGNOSIS — B351 Tinea unguium: Secondary | ICD-10-CM | POA: Diagnosis not present

## 2016-09-24 DIAGNOSIS — Z79899 Other long term (current) drug therapy: Secondary | ICD-10-CM | POA: Diagnosis not present

## 2016-09-24 NOTE — Patient Instructions (Signed)
Peroneal Tendinopathy Rehab Ask your health care provider which exercises are safe for you. Do exercises exactly as told by your health care provider and adjust them as directed. It is normal to feel mild stretching, pulling, tightness, or discomfort as you do these exercises, but you should stop right away if you feel sudden pain or your pain gets worse.Do not begin these exercises until told by your health care provider. Stretching and range of motion exercises These exercises warm up your muscles and joints and improve the movement and flexibility of your ankle. These exercises also help to relieve pain and stiffness. Exercise A: Gastroc and soleus, standing 1. Stand on the edge of a step on the balls of your feet. The ball of your foot is on the walking surface, right under your toes. 2. Hold onto the railing for balance. 3. Slowly lift your left / right foot, allowing your body weight to press your left / right heel down over the edge of the step. You should feel a stretch in your left / right calf. 4. Hold this position for __________ seconds. Repeat __________ times with your left / right knee straight and __________ times with your left / right knee bent. Complete this stretch __________ times per day. Strengthening exercises These exercises improve the strength and endurance of your foot and ankle. Endurance is the ability to use your muscles for a long time, even after they get tired. Exercise B: Dorsiflexors  1. Secure a rubber exercise band or tube to an object, like a table leg, that will not move if it is pulled on. 2. Secure the other end of the band around your left / right foot. 3. Sit on the floor, facing the object with your left / right foot extended. The band or tube should be slightly tense when your foot is relaxed. 4. Slowly flex your left / right ankle and toes to bring your foot toward you. 5. Hold this position for __________ seconds. 6. Slowly return your foot to the  starting position. Repeat __________ times. Complete this exercise __________ times per day. Exercise C: Evertors 1. Sit on the floor with your legs straight out in front of you. 2. Loop a rubber exercise or band or tube around the ball of your left / right foot. The ball of your foot is on the walking surface, right under your toes. 3. Hold the ends of the band in your hands, or secure the band to a stable object. 4. Slowly push your foot outward, away from your other leg. 5. Hold this position for __________ seconds. 6. Slowly return your foot to the starting position. Repeat __________ times. Complete this exercise __________ times per day. Exercise D: Standing heel raise ( plantar flexion) 1. Stand with your feet shoulder-width apart with the balls of your feet on a step. The ball of your foot is on the walking surface, right under your toes. 2. Keep your weight spread evenly over the width of your feet while you rise up on your toes. Use a wall or railing to steady yourself, but try not to use it for support. 3. If this exercise is too easy, try these options: ? Shift your weight toward your left / right leg until you feel challenged. ? If told by your health care provider, stand on your left / right leg only. 4. Hold this position for __________ seconds. Repeat __________ times. Complete this exercise __________ times per day. Exercise E: Single leg stand 1.  Without shoes, stand near a railing or in a doorway. You may hold onto the railing or door frame as needed. 2. Stand on your left / right foot. Keep your big toe down on the floor and try to keep your arch lifted. ? Do not roll to the outside of your foot. ? If this exercise is too easy, you can try it with your eyes closed or while standing on a pillow. 3. Hold this position for __________ seconds. Repeat __________ times. Complete this exercise __________ times per day. This information is not intended to replace advice given to  you by your health care provider. Make sure you discuss any questions you have with your health care provider. Document Released: 01/15/2005 Document Revised: 09/22/2015 Document Reviewed: 12/04/2014 Elsevier Interactive Patient Education  2018 [employer].   Terbinafine oral granules What is this medicine? TERBINAFINE (TER bin a feen) is an antifungal medicine. It is used to treat certain kinds of fungal or yeast infections. This medicine may be used for other purposes; ask your health care provider or pharmacist if you have questions. COMMON BRAND NAME(S): Lamisil What should I tell my health care provider before I take this medicine? They need to know if you have any of these conditions: -drink alcoholic beverages -kidney disease -liver disease -an unusual or allergic reaction to Terbinafine, other medicines, foods, dyes, or preservatives -pregnant or trying to get pregnant -breast-feeding How should I use this medicine? Take this medicine by mouth. Follow the directions on the prescription label. Hold packet with cut line on top. Shake packet gently to settle contents. Tear packet open along cut line, or use scissors to cut across line. Carefully pour the entire contents of packet onto a spoonful of a soft food, such as pudding or other soft, non-acidic food such as mashed potatoes (do NOT use applesauce or a fruit-based food). If two packets are required for each dose, you may either sprinkle the content of both packets on one spoonful of non-acidic food, or sprinkle the contents of both packets on two spoonfuls of non-acidic food. Make sure that no granules remain in the packet. Swallow the mxiture of the food and granules without chewing. Take your medicine at regular intervals. Do not take it more often than directed. Take all of your medicine as directed even if you think you are better. Do not skip doses or stop your medicine early. Contact your pediatrician or health care  professional regarding the use of this medicine in children. While this medicine may be prescribed for children as young as 4 years for selected conditions, precautions do apply. Overdosage: If you think you have taken too much of this medicine contact a poison control center or emergency room at once. NOTE: This medicine is only for you. Do not share this medicine with others. What if I miss a dose? If you miss a dose, take it as soon as you can. If it is almost time for your next dose, take only that dose. Do not take double or extra doses. What may interact with this medicine? Do not take this medicine with any of the following medications: -thioridazine This medicine may also interact with the following medications: -beta-blockers -caffeine -cimetidine -cyclosporine -MAOIs like Carbex, Eldepryl, Marplan, Nardil, and Parnate -medicines for fungal infections like fluconazole and ketoconazole -medicines for irregular heartbeat like amiodarone, flecainide and propafenone -rifampin -SSRIs like citalopram, escitalopram, fluoxetine, fluvoxamine, paroxetine and sertraline -tricyclic antidepressants like amitriptyline, clomipramine, desipramine, imipramine, nortriptyline, and others -warfarin This  list may not describe all possible interactions. Give your health care provider a list of all the medicines, herbs, non-prescription drugs, or dietary supplements you use. Also tell them if you smoke, drink alcohol, or use illegal drugs. Some items may interact with your medicine. What should I watch for while using this medicine? Your doctor may monitor your liver function. Tell your doctor right away if you have nausea or vomiting, loss of appetite, stomach pain on your right upper side, yellow skin, dark urine, light stools, or are over tired. You need to take this medicine for 6 weeks or longer to cure the fungal infection. Take your medicine regularly for as long as your doctor or health care  professional tells you to. What side effects may I notice from receiving this medicine? Side effects that you should report to your doctor or health care professional as soon as possible: -allergic reactions like skin rash or hives, swelling of the face, lips, or tongue -change in vision -dark urine -fever or infection -general ill feeling or flu-like symptoms -light-colored stools -loss of appetite, nausea -redness, blistering, peeling or loosening of the skin, including inside the mouth -right upper belly pain -unusually weak or tired -yellowing of the eyes or skin Side effects that usually do not require medical attention (report to your doctor or health care professional if they continue or are bothersome): -changes in taste -diarrhea -hair loss -muscle or joint pain -stomach upset This list may not describe all possible side effects. Call your doctor for medical advice about side effects. You may report side effects to FDA at 1-800-FDA-1088. Where should I keep my medicine? Keep out of the reach of children. Store at room temperature between 15 and 30 degrees C (59 and 86 degrees F). Throw away any unused medicine after the expiration date. NOTE: This sheet is a summary. It may not cover all possible information. If you have questions about this medicine, talk to your doctor, pharmacist, or health care provider.  2018 Elsevier/Gold Standard (2007-03-28 17:25:48)

## 2016-09-26 NOTE — Progress Notes (Signed)
Subjective: Mr. Whittenberg presents the office today for follow-up evaluation of peroneal tendinitis as well as for onychomycosis. He states that the tendinitis is doing better. He is able to run 5 miles the any pain to his foot. He states the pain is intermittent in nature. Denies any recent injury or trauma denies he swelling or redness. He also does remain on Lamisil last month. He said no side effects the medication. He has tried no some the nail has started become somewhat clear towards the base. He denies any pain or swelling to the toenails. Denies any systemic complaints such as fevers, chills, nausea, vomiting. No acute changes since last appointment, and no other complaints at this time.   Objective: AAO x3, NAD DP/PT pulses palpable bilaterally, CRT less than 3 seconds This time is no tenderness along the course or insertion of the peroneal tendon notes that the metatarsal base the left foot. There is no area pinpoint bony tenderness there is no pain vibratory sensation. Nails are hypertrophic, dystrophic, discolored with yellow-brown discoloration how there does appear to be evidence of clearing along the proximal nail borders. No drainage or pus expressed there is no pain to the nails. No open lesions or pre-ulcerative lesions.  No pain with calf compression, swelling, warmth, erythema  Assessment: Resolving onychomycosis with resolving peroneal tendinitis  Plan: -All treatment options discussed with the patient including all alternatives, risks, complications.  -In regards the tendinitis discussed supportive shoes as well as orthotics. We have exercises for peroneal tendinitis. Anti-inflammatories as needed. Ice to the area. -Finish Lamisil for total of 3 months. We will recheck blood work next couple of weeks. I would hadn't ordered CBC and LFTs for him. Continue to monitor for side effects which we can discuss today. -Patient encouraged to call the office with any questions, concerns,  change in symptoms.   Celesta Gentile, DPM

## 2016-10-16 ENCOUNTER — Encounter (INDEPENDENT_AMBULATORY_CARE_PROVIDER_SITE_OTHER): Payer: Self-pay | Admitting: Internal Medicine

## 2016-10-25 ENCOUNTER — Encounter: Payer: Self-pay | Admitting: *Deleted

## 2016-10-25 ENCOUNTER — Ambulatory Visit (INDEPENDENT_AMBULATORY_CARE_PROVIDER_SITE_OTHER): Payer: TRICARE For Life (TFL) | Admitting: *Deleted

## 2016-10-25 DIAGNOSIS — Z23 Encounter for immunization: Secondary | ICD-10-CM

## 2016-11-26 ENCOUNTER — Ambulatory Visit: Payer: TRICARE For Life (TFL) | Admitting: Podiatry

## 2017-01-08 ENCOUNTER — Ambulatory Visit (INDEPENDENT_AMBULATORY_CARE_PROVIDER_SITE_OTHER): Payer: TRICARE For Life (TFL) | Admitting: Internal Medicine

## 2017-02-05 ENCOUNTER — Encounter: Payer: Self-pay | Admitting: Family Medicine

## 2017-02-07 ENCOUNTER — Other Ambulatory Visit: Payer: TRICARE For Life (TFL)

## 2017-02-07 ENCOUNTER — Other Ambulatory Visit: Payer: Self-pay | Admitting: Family Medicine

## 2017-02-07 DIAGNOSIS — Z111 Encounter for screening for respiratory tuberculosis: Secondary | ICD-10-CM

## 2017-02-09 LAB — QUANTIFERON-TB GOLD PLUS
Mitogen-NIL: >10 IU/mL
NIL: 0.03 IU/mL
QUANTIFERON-TB GOLD PLUS: NEGATIVE
TB1-NIL: <0 IU/mL
TB2-NIL: 0 IU/mL

## 2017-02-11 ENCOUNTER — Encounter: Payer: Self-pay | Admitting: Family Medicine

## 2017-02-25 ENCOUNTER — Encounter (INDEPENDENT_AMBULATORY_CARE_PROVIDER_SITE_OTHER): Payer: Self-pay | Admitting: Internal Medicine

## 2017-02-25 ENCOUNTER — Ambulatory Visit (INDEPENDENT_AMBULATORY_CARE_PROVIDER_SITE_OTHER): Admitting: Internal Medicine

## 2017-02-25 VITALS — BP 130/82 | HR 60 | Temp 98.7°F | Ht 71.0 in | Wt 236.0 lb

## 2017-02-25 DIAGNOSIS — D175 Benign lipomatous neoplasm of intra-abdominal organs: Secondary | ICD-10-CM | POA: Diagnosis not present

## 2017-02-25 NOTE — Patient Instructions (Signed)
Notify if you have abdominal pain or rectal bleeding. Abdominopelvic CT in 2 years prior to office visit. Office will call.

## 2017-02-25 NOTE — Progress Notes (Signed)
Presenting complaint;  Follow-up regarding sigmoid colon lipoma.  Database and subjective:  Patient is 57 year old Caucasian male who underwent screening colonoscopy on 08/29/2016 and was found to have large lipoma and distal sigmoid colon.  Rest of the examination was normal.  Patient was asymptomatic and I decided not to remove this lipoma. Abdominopelvic CT was obtained on 09/06/2016 revealing 2.5 cm lipoma in sigmoid colon. I recommended patient monitor symptoms and return for office visit in 6 months.  Willie Coleman has no symptoms whatsoever.  He generally has regular bowel movements.  Every now and then he has to strain and occasionally has loose stool.  He denies abdominal pain melena or rectal bleeding.    Current Medications: Outpatient Encounter Medications as of 02/25/2017  Medication Sig  . KRILL OIL PO Take 1 capsule by mouth daily.  . Multiple Vitamin (MULTIVITAMIN WITH MINERALS) TABS tablet Take 1 tablet by mouth daily.  . propranolol (INDERAL) 10 MG tablet TAKE 1 TABLET(10 MG) BY MOUTH THREE TIMES DAILY (Patient taking differently: TAKE 1 TABLET(10 MG) BY MOUTH DAILY AS NEEDED FOR ANXIETY)  . [DISCONTINUED] meloxicam (MOBIC) 15 MG tablet Take 1 tablet (15 mg total) by mouth daily. (Patient not taking: Reported on 02/25/2017)  . [DISCONTINUED] terbinafine (LAMISIL) 250 MG tablet Take 1 tablet (250 mg total) by mouth daily. (Patient not taking: Reported on 02/25/2017)   No facility-administered encounter medications on file as of 02/25/2017.      Objective: Blood pressure 130/82, pulse 60, temperature 98.7 F (37.1 C), temperature source Oral, height 5\' 11"  (1.803 m), weight 236 lb (107 kg). Patient is alert and in no acute distress. Abdomen is symmetrical soft and nontender without organomegaly or masses. No LE edema or clubbing noted.  Labs/studies Results: CT and colonoscopy images reviewed with patient.   Assessment:  #1.  2.5 cm size lipoma at sigmoid colon.  Patient has  no symptoms whatsoever.  Significant risk would be 1 of intussusception if he was to develop diarrhea.  Since he is asymptomatic it would be reasonable to follow him and consider repeat CT in 2 years to make sure the lipoma is not enlarging.   Plan:  Patient will call if he has abdominal pain rectal bleeding or discharge. He will undergo abdominopelvic CT in 2 years prior to office visit.

## 2017-03-27 ENCOUNTER — Ambulatory Visit (INDEPENDENT_AMBULATORY_CARE_PROVIDER_SITE_OTHER): Admitting: Family Medicine

## 2017-03-27 DIAGNOSIS — Z23 Encounter for immunization: Secondary | ICD-10-CM | POA: Diagnosis not present

## 2017-06-13 ENCOUNTER — Other Ambulatory Visit: Payer: Self-pay | Admitting: Family Medicine

## 2017-06-13 ENCOUNTER — Encounter: Payer: Self-pay | Admitting: Family Medicine

## 2017-06-13 DIAGNOSIS — Z111 Encounter for screening for respiratory tuberculosis: Secondary | ICD-10-CM

## 2017-06-17 ENCOUNTER — Other Ambulatory Visit

## 2017-06-17 DIAGNOSIS — Z111 Encounter for screening for respiratory tuberculosis: Secondary | ICD-10-CM

## 2017-06-19 LAB — QUANTIFERON-TB GOLD PLUS
Mitogen-NIL: 6.23 IU/mL
NIL: 0.01 IU/mL
QUANTIFERON-TB GOLD PLUS: NEGATIVE
TB1-NIL: 0.02 [IU]/mL
TB2-NIL: 0.02 [IU]/mL

## 2017-12-05 ENCOUNTER — Other Ambulatory Visit: Payer: Self-pay

## 2017-12-05 ENCOUNTER — Emergency Department (HOSPITAL_COMMUNITY)

## 2017-12-05 ENCOUNTER — Emergency Department (HOSPITAL_COMMUNITY)
Admission: EM | Admit: 2017-12-05 | Discharge: 2017-12-05 | Disposition: A | Discharge status: Home / Self Care | Attending: Emergency Medicine | Admitting: Emergency Medicine

## 2017-12-05 ENCOUNTER — Encounter (HOSPITAL_COMMUNITY): Payer: Self-pay

## 2017-12-05 DIAGNOSIS — R42 Dizziness and giddiness: Secondary | ICD-10-CM | POA: Diagnosis present

## 2017-12-05 DIAGNOSIS — I10 Essential (primary) hypertension: Secondary | ICD-10-CM | POA: Insufficient documentation

## 2017-12-05 DIAGNOSIS — Z87891 Personal history of nicotine dependence: Secondary | ICD-10-CM | POA: Diagnosis not present

## 2017-12-05 LAB — COMPREHENSIVE METABOLIC PANEL
ALK PHOS: 53 U/L (ref 38–126)
ALT: 26 U/L (ref 0–44)
ANION GAP: 10 (ref 5–15)
AST: 25 U/L (ref 15–41)
Albumin: 4.5 g/dL (ref 3.5–5.0)
BUN: 14 mg/dL (ref 6–20)
CALCIUM: 9.5 mg/dL (ref 8.9–10.3)
CO2: 23 mmol/L (ref 22–32)
Chloride: 104 mmol/L (ref 98–111)
Creatinine, Ser: 1.13 mg/dL (ref 0.61–1.24)
GFR calc Af Amer: >60 mL/min (ref >60)
GFR calc non Af Amer: >60 mL/min (ref >60)
Glucose, Bld: 123 mg/dL — ABNORMAL HIGH (ref 70–99)
Potassium: 4 mmol/L (ref 3.5–5.1)
SODIUM: 137 mmol/L (ref 135–145)
TOTAL PROTEIN: 7.4 g/dL (ref 6.5–8.1)
Total Bilirubin: 0.7 mg/dL (ref 0.3–1.2)

## 2017-12-05 LAB — CBC WITH DIFFERENTIAL/PLATELET
Abs Immature Granulocytes: 0.02 10*3/uL (ref 0.00–0.07)
BASOS PCT: 0 %
Basophils Absolute: 0 10*3/uL (ref 0.0–0.1)
Eosinophils Absolute: 0.1 10*3/uL (ref 0.0–0.5)
Eosinophils Relative: 2 %
HCT: 45.3 % (ref 39.0–52.0)
HEMOGLOBIN: 15.8 g/dL (ref 13.0–17.0)
Immature Granulocytes: 0 %
LYMPHS PCT: 23 %
Lymphs Abs: 1.6 10*3/uL (ref 0.7–4.0)
MCH: 32.3 pg (ref 26.0–34.0)
MCHC: 34.9 g/dL (ref 30.0–36.0)
MCV: 92.6 fL (ref 80.0–100.0)
MONO ABS: 0.4 10*3/uL (ref 0.1–1.0)
Monocytes Relative: 5 %
Neutro Abs: 4.8 10*3/uL (ref 1.7–7.7)
Neutrophils Relative %: 70 %
Platelets: 223 10*3/uL (ref 150–400)
RBC: 4.89 MIL/uL (ref 4.22–5.81)
RDW: 11.2 % — AB (ref 11.5–15.5)
WBC: 6.9 10*3/uL (ref 4.0–10.5)
nRBC: 0 % (ref 0.0–0.2)

## 2017-12-05 LAB — URINALYSIS, ROUTINE W REFLEX MICROSCOPIC
BACTERIA UA: NONE SEEN
BILIRUBIN URINE: NEGATIVE
Glucose, UA: NEGATIVE mg/dL
HGB URINE DIPSTICK: NEGATIVE
Ketones, ur: 5 mg/dL — AB
LEUKOCYTES UA: NEGATIVE
Nitrite: NEGATIVE
PH: 7 (ref 5.0–8.0)
Protein, ur: NEGATIVE mg/dL
SPECIFIC GRAVITY, URINE: 1.02 (ref 1.005–1.030)

## 2017-12-05 MED ORDER — LACTATED RINGERS IV BOLUS
1000.0000 mL | Freq: Once | INTRAVENOUS | Status: AC
  Administered 2017-12-05: 1000 mL via INTRAVENOUS

## 2017-12-05 MED ORDER — MECLIZINE HCL 25 MG PO TABS: 25.0000 mg | ORAL_TABLET | Freq: Three times a day (TID) | ORAL | 0 refills | Status: DC | PRN

## 2017-12-05 MED ORDER — MECLIZINE HCL 25 MG PO TABS
25.0000 mg | ORAL_TABLET | Freq: Once | ORAL | Status: AC
  Administered 2017-12-05: 25 mg via ORAL
  Filled 2017-12-05: qty 1

## 2017-12-05 MED ORDER — LORAZEPAM 1 MG PO TABS: 1.0000 mg | ORAL_TABLET | Freq: Three times a day (TID) | ORAL | 0 refills | Status: DC | PRN

## 2017-12-05 MED ORDER — LORAZEPAM 2 MG/ML IJ SOLN
1.0000 mg | Freq: Once | INTRAMUSCULAR | Status: AC
  Administered 2017-12-05: 1 mg via INTRAVENOUS
  Filled 2017-12-05: qty 1

## 2017-12-05 MED ORDER — GADOBUTROL 1 MMOL/ML IV SOLN
10.0000 mL | Freq: Once | INTRAVENOUS | Status: AC | PRN
  Administered 2017-12-05: 10 mL via INTRAVENOUS

## 2017-12-05 NOTE — ED Provider Notes (Signed)
Emergency Department Provider Note   I have reviewed the triage vital signs and the nursing notes.   HISTORY  Chief Complaint Dizziness   HPI Willie Coleman is a 57 y.o. male with untreated hypertension the presents to the emergency department today with episodic feelings of lightheadedness and spacey feeling with balance issues over the weekend.  They were short-lived and always resolved but were always with movement.  An episode last night after eating supper last for couple hours but improved after taking a nap.  He woke up this morning around 5:00 with the same episode but has been persistent.  States he has to hold onto furniture to walk but has not fallen the one side or the other.  No vision changes.  No headaches.  No neurologic changes otherwise.  States only way gets better is by laying still.  No history of the same.  No recent illnesses. No other associated or modifying symptoms.    Past Medical History:  Diagnosis Date  . Hypertension     Patient Active Problem List   Diagnosis Date Noted  . Special screening for malignant neoplasms, colon 05/03/2016    Past Surgical History:  Procedure Laterality Date  . COLONOSCOPY N/A 08/29/2016   Procedure: COLONOSCOPY;  Surgeon: Rogene Houston, MD;  Location: [facility];  Service: Endoscopy;  Laterality: N/A;  830  . HERNIA REPAIR     inguinal  . TONSILLECTOMY      Current Outpatient Rx  . Order #: [id number] Class: Historical Med  . Order #: [id number] Class: Print  . Order #: [id number] Class: Print  . Order #: [id number] Class: Historical Med  . Order #: [id number] Class: Normal    Allergies Patient has no known allergies.  History reviewed. No pertinent family history.  Social History Social History   Tobacco Use  . Smoking status: Former Smoker    Years: 30.00    Types: Cigarettes    Last attempt to quit: 09/19/2012    Years since quitting: 5.2  . Smokeless tobacco: Never Used  Substance Use Topics  .  Alcohol use: Yes    Comment: 2 drinks per week  . Drug use: No    Review of Systems  All other systems negative except as documented in the HPI. All pertinent positives and negatives as reviewed in the HPI. ____________________________________________   PHYSICAL EXAM:  VITAL SIGNS: ED Triage Vitals  Enc Vitals Group     BP 12/05/17 0936 (!) 149/100     Pulse Rate 12/05/17 0936 61     Resp 12/05/17 0936 16     Temp 12/05/17 0936 (!) 97.3 F (36.3 C)     Temp Source 12/05/17 0936 Oral     SpO2 12/05/17 0936 100 %     Weight --      Height --      Head Circumference --      Peak Flow --      Pain Score 12/05/17 0934 0     Pain Loc --      Pain Edu? --      Excl. in [place]? --     Constitutional: Alert and oriented. Well appearing and in no acute distress. Eyes: Conjunctivae are normal. PERRL. EOMI. Head: Atraumatic. Nose: No congestion/rhinnorhea. Mouth/Throat: Mucous membranes are moist.  Oropharynx non-erythematous. Neck: No stridor.  No meningeal signs.   Cardiovascular: Normal rate, regular rhythm. Good peripheral circulation. Grossly normal heart sounds.   Respiratory: Normal respiratory effort.  No retractions. Lungs CTAB. Gastrointestinal:  Soft and nontender. No distention.  Musculoskeletal: No lower extremity tenderness nor edema. No gross deformities of extremities. Neurologic:  Normal speech and language. No gross focal neurologic deficits are appreciated. No altered mental status, able to give full seemingly accurate history.  Face is symmetric, EOM's intact, pupils equal and reactive, vision intact, tongue and uvula midline without deviation. Upper and Lower extremity motor 5/5, intact pain perception in distal extremities, 2+ reflexes in biceps, patella tendons.  Skin:  Skin is warm, dry and intact. No rash noted.   ____________________________________________   LABS (all labs ordered are listed, but only abnormal results are displayed)  Labs Reviewed  CBC  WITH DIFFERENTIAL/PLATELET - Abnormal; Notable for the following components:      Result Value   RDW 11.2 (*)    All other components within normal limits  COMPREHENSIVE METABOLIC PANEL - Abnormal; Notable for the following components:   Glucose, Bld 123 (*)    All other components within normal limits  URINALYSIS, ROUTINE W REFLEX MICROSCOPIC - Abnormal; Notable for the following components:   Ketones, ur 5 (*)    All other components within normal limits   ____________________________________________  EKG   EKG Interpretation  Date/Time:  Thursday December 05 2017 09:36:53 EST Ventricular Rate:  67 PR Interval:    QRS Duration: 112 QT Interval:  434 QTC Calculation: 459 R Axis:   49 Text Interpretation:  Sinus rhythm Borderline intraventricular conduction delay No old tracing to compare Confirmed by Merrily Pew [phone]) on 12/05/2017 9:54:54 AM       ____________________________________________  RADIOLOGY  Ct Head Wo Contrast  Result Date: 12/05/2017 CLINICAL DATA:  Acute onset of vertigo beginning last night. EXAM: CT HEAD WITHOUT CONTRAST TECHNIQUE: Contiguous axial images were obtained from the base of the skull through the vertex without intravenous contrast. COMPARISON:  None. FINDINGS: Brain: The brain shows a normal appearance without evidence of malformation, atrophy, old or acute small or large vessel infarction, mass lesion, hemorrhage, hydrocephalus or extra-axial collection. Vascular: No hyperdense vessel. No evidence of atherosclerotic calcification. Skull: Normal.  No traumatic finding.  No focal bone lesion. Sinuses/Orbits: Mild sinus mucosal inflammatory changes, not likely significant. Middle ears and mastoids are clear. Orbits negative. Other: None significant IMPRESSION: Normal head CT. Mild mucosal inflammation of the paranasal sinuses, not likely relevant to the clinical presentation. Electronically Signed   By: Nelson Chimes M.D.   On: 12/05/2017 11:40   Mr  Brain W And Wo Contrast  Result Date: 12/05/2017 CLINICAL DATA:  Persistent central vertigo EXAM: MRI HEAD WITHOUT AND WITH CONTRAST TECHNIQUE: Multiplanar, multiecho pulse sequences of the brain and surrounding structures were obtained without and with intravenous contrast. CONTRAST:  10 cc Gadavist intravenous COMPARISON:  Head CT from earlier today FINDINGS: Brain: No acute infarction, hemorrhage, hydrocephalus, extra-axial collection or mass lesion. No findings in the brainstem, cerebellum, cisterns, or temporal bones to explain vertigo. Vascular: Major flow voids are preserved, including vertebrobasilar. Skull and upper cervical spine: No evidence of marrow lesion Sinuses/Orbits: Overall mild generalized mucosal thickening in the paranasal sinuses. Small retention cysts noted in the inferior maxillary sinuses. No fluid levels. No mastoid or middle ear opacification. IMPRESSION: No acute finding or explanation for vertigo. Electronically Signed   By: Monte Fantasia M.D.   On: 12/05/2017 15:11    ____________________________________________   PROCEDURES  Procedure(s) performed:   Procedures   ____________________________________________   INITIAL IMPRESSION / ASSESSMENT AND PLAN / ED COURSE  Symptoms seem to be very positional  so suspect possible vertigo but no medical history with stuttering symptoms, will ct and possibly MRI to ensure no stroke. Meclizine/fluids.   Work-up negative for any central cause for his symptoms.  He does lean to the left as his difficulty walking however with a negative CT negative MRI and minimal risk factors for central cause is still think this is probably peripheral.  We will have him follow-up with ENT and do Epley maneuvers with symptomatic treatment at home.  Pertinent labs & imaging results that were available during my care of the patient were reviewed by me and considered in my medical decision making (see chart for  details).  ____________________________________________  FINAL CLINICAL IMPRESSION(S) / ED DIAGNOSES  Final diagnoses:  Dizziness  Vertigo     MEDICATIONS GIVEN DURING THIS VISIT:  Medications  lactated ringers bolus 1,000 mL (0 mLs Intravenous Stopped 12/05/17 1137)  meclizine (ANTIVERT) tablet 25 mg (25 mg Oral Given 12/05/17 1015)  LORazepam (ATIVAN) injection 1 mg (1 mg Intravenous Given 12/05/17 1407)  gadobutrol (GADAVIST) 1 MMOL/ML injection 10 mL (10 mLs Intravenous Contrast Given 12/05/17 1452)     NEW OUTPATIENT MEDICATIONS STARTED DURING THIS VISIT:  Discharge Medication List as of 12/05/2017  3:36 PM    START taking these medications   Details  LORazepam (ATIVAN) 1 MG tablet Take 1 tablet (1 mg total) by mouth 3 (three) times daily as needed for anxiety., Starting Thu 12/05/2017, Print    meclizine (ANTIVERT) 25 MG tablet Take 1 tablet (25 mg total) by mouth 3 (three) times daily as needed for dizziness., Starting Thu 12/05/2017, Print        Note:  This note was prepared with assistance of Dragon voice recognition software. Occasional wrong-word or sound-a-like substitutions may have occurred due to the inherent limitations of voice recognition software.   Merrily Pew, MD 12/06/17 [phone]

## 2017-12-05 NOTE — ED Triage Notes (Signed)
Pt coming from home with complaint of dizziness. Had an episode yesterday but resolved. Pt states he woke up at 0500 with dizziness today.

## 2017-12-05 NOTE — ED Notes (Signed)
Pt aware of the need of a urine sample.

## 2017-12-05 NOTE — ED Notes (Signed)
[phone]  [phone] - Willie Coleman significant other to be called after MRI.

## 2018-03-20 ENCOUNTER — Encounter: Payer: Self-pay | Admitting: Family Medicine

## 2018-03-21 ENCOUNTER — Encounter: Admitting: Family Medicine

## 2018-03-24 ENCOUNTER — Encounter: Payer: Self-pay | Admitting: Family Medicine

## 2018-03-24 ENCOUNTER — Ambulatory Visit (INDEPENDENT_AMBULATORY_CARE_PROVIDER_SITE_OTHER): Admitting: Family Medicine

## 2018-03-24 VITALS — BP 138/98 | HR 80 | Temp 98.1°F | Resp 16 | Ht 71.0 in | Wt 239.0 lb

## 2018-03-24 DIAGNOSIS — Z0000 Encounter for general adult medical examination without abnormal findings: Secondary | ICD-10-CM

## 2018-03-24 DIAGNOSIS — D175 Benign lipomatous neoplasm of intra-abdominal organs: Secondary | ICD-10-CM

## 2018-03-24 DIAGNOSIS — R140 Abdominal distension (gaseous): Secondary | ICD-10-CM

## 2018-03-24 DIAGNOSIS — K5909 Other constipation: Secondary | ICD-10-CM

## 2018-03-24 DIAGNOSIS — Z122 Encounter for screening for malignant neoplasm of respiratory organs: Secondary | ICD-10-CM | POA: Diagnosis not present

## 2018-03-24 MED ORDER — AMLODIPINE BESYLATE 10 MG PO TABS: 10.0000 mg | ORAL_TABLET | Freq: Every day | ORAL | 3 refills | Status: DC

## 2018-03-24 NOTE — Progress Notes (Signed)
Subjective:    Patient ID: Willie Coleman, male    DOB: 1960/10/22, 58 y.o.   MRN: [id number]  HPI The patient is a very pleasant 58 year old Caucasian male here today for complete physical exam. His last colonoscopy was in 2018 and was clear.  Due again in 2028.  However during his colonoscopy he was found to have a 2.5 cm lipoma in the sigmoid colon.  He states that over the last several months, he has had progressively worsening constipation and left lower quadrant abdominal pain.  He is concerned that the lipoma is growing and could be partially obstructing his sigmoid colon.  He denies any blood in his stool or melena.  He denies any fevers or chills.  Patient smoked more than 30 years a pack of cigarettes a day.  He quit approximately 8 years ago however he would qualify for lung cancer screening with an annual CT scan.  We discussed this at length and he would like to schedule.  His blood pressure is elevated today at 138/98.  He has similar readings at the hospital as well as at an outpatient clinic Past Medical History:  Diagnosis Date  . Hypertension    Past Surgical History:  Procedure Laterality Date  . COLONOSCOPY N/A 08/29/2016   Procedure: COLONOSCOPY;  Surgeon: Rogene Houston, MD;  Location: [facility];  Service: Endoscopy;  Laterality: N/A;  830  . HERNIA REPAIR     inguinal  . TONSILLECTOMY     Current Outpatient Medications on File Prior to Visit  Medication Sig Dispense Refill  . KRILL OIL PO Take 1 capsule by mouth daily.    . meclizine (ANTIVERT) 25 MG tablet Take 1 tablet (25 mg total) by mouth 3 (three) times daily as needed for dizziness. 30 tablet 0  . Multiple Vitamin (MULTIVITAMIN WITH MINERALS) TABS tablet Take 1 tablet by mouth daily.    . propranolol (INDERAL) 10 MG tablet TAKE 1 TABLET(10 MG) BY MOUTH THREE TIMES DAILY (Patient taking differently: TAKE 1 TABLET(10 MG) BY MOUTH DAILY AS NEEDED FOR ANXIETY) 90 tablet 0   No current facility-administered  medications on file prior to visit.    No Known Allergies Social History   Socioeconomic History  . Marital status: Divorced    Spouse name: Not on file  . Number of children: Not on file  . Years of education: Not on file  . Highest education level: Not on file  Occupational History  . Not on file  Social Needs  . Financial resource strain: Not on file  . Food insecurity:    Worry: Not on file    Inability: Not on file  . Transportation needs:    Medical: Not on file    Non-medical: Not on file  Tobacco Use  . Smoking status: Former Smoker    Years: 30.00    Types: Cigarettes    Last attempt to quit: 09/19/2012    Years since quitting: 5.5  . Smokeless tobacco: Never Used  Substance and Sexual Activity  . Alcohol use: Yes    Comment: 2 drinks per week  . Drug use: No  . Sexual activity: Not on file    Comment: adopted  Lifestyle  . Physical activity:    Days per week: Not on file    Minutes per session: Not on file  . Stress: Not on file  Relationships  . Social connections:    Talks on phone: Not on file    Gets together:  Not on file    Attends religious service: Not on file    Active member of club or organization: Not on file    Attends meetings of clubs or organizations: Not on file    Relationship status: Not on file  . Intimate partner violence:    Fear of current or ex partner: Not on file    Emotionally abused: Not on file    Physically abused: Not on file    Forced sexual activity: Not on file  Other Topics Concern  . Not on file  Social History Narrative  . Not on file   No family history on file. Family history is unknown. He is adopted and has no knowledge of his biologic parents   Review of Systems  All other systems reviewed and are negative.      Objective:   Physical Exam  Constitutional: He is oriented to person, place, and time. He appears well-developed and well-nourished. No distress.  HENT:  Head: Normocephalic and atraumatic.   Right Ear: External ear normal.  Left Ear: External ear normal.  Nose: Nose normal.  Mouth/Throat: Oropharynx is clear and moist. No oropharyngeal exudate.  Eyes: Pupils are equal, round, and reactive to light. Conjunctivae and EOM are normal. Right eye exhibits no discharge. Left eye exhibits no discharge. No scleral icterus.  Neck: Normal range of motion. Neck supple. No JVD present. No tracheal deviation present. No thyromegaly present.  Cardiovascular: Normal rate, regular rhythm, normal heart sounds and intact distal pulses. Exam reveals no gallop and no friction rub.  No murmur heard. Pulmonary/Chest: Effort normal and breath sounds normal. No stridor. No respiratory distress. He has no wheezes. He has no rales. He exhibits no tenderness.  Abdominal: Soft. Bowel sounds are normal. He exhibits no distension. There is no abdominal tenderness. There is no rebound and no guarding.  Musculoskeletal: Normal range of motion.        General: No tenderness, deformity or edema.  Lymphadenopathy:    He has no cervical adenopathy.  Neurological: He is alert and oriented to person, place, and time. He has normal reflexes. No cranial nerve deficit. He exhibits normal muscle tone. Coordination normal.  Skin: Skin is warm. No rash noted. He is not diaphoretic. No erythema. No pallor.  Psychiatric: He has a normal mood and affect. His behavior is normal. Judgment and thought content normal.  Vitals reviewed.         Assessment & Plan:  General medical exam  Lipoma of colon - Plan: CT Abdomen Pelvis W Contrast  Chronic constipation - Plan: CT Abdomen Pelvis W Contrast  Encounter for screening for lung cancer  Encounter for screening for malignant neoplasm of respiratory organs - Plan: CT CHEST LUNG CA SCREEN LOW DOSE W/O CM  Abdominal distension (gaseous) - Plan: CT Abdomen Pelvis W Contrast  Return fasting for a CBC, CMP, fasting lipid panel, and PSA.  Treat his blood pressure with  amlodipine 10 mg a day and recheck blood pressure in 2 weeks.  Patient would like to repeat a CT scan of the abdomen pelvis to see if the lipoma has grown and if so, he would like to proceed with repeat colonoscopy to have the mass removed as it is now causing symptoms including distention, abdominal pain, and chronic constipation.  I will schedule the patient for screening for lung cancer with a low-dose CT scan.  Immunizations are up-to-date.

## 2018-03-31 ENCOUNTER — Other Ambulatory Visit

## 2018-03-31 DIAGNOSIS — Z0000 Encounter for general adult medical examination without abnormal findings: Secondary | ICD-10-CM

## 2018-04-01 LAB — CBC WITH DIFFERENTIAL/PLATELET
ABSOLUTE MONOCYTES: 371 {cells}/uL (ref 200–950)
Basophils Absolute: 51 cells/uL (ref 0–200)
Basophils Relative: 0.9 %
EOS ABS: 308 {cells}/uL (ref 15–500)
Eosinophils Relative: 5.4 %
HCT: 41.4 % (ref 38.5–50.0)
Hemoglobin: 14.8 g/dL (ref 13.2–17.1)
Lymphs Abs: 2160 cells/uL (ref 850–3900)
MCH: 33.3 pg — AB (ref 27.0–33.0)
MCHC: 35.7 g/dL (ref 32.0–36.0)
MCV: 93 fL (ref 80.0–100.0)
MONOS PCT: 6.5 %
MPV: 10.3 fL (ref 7.5–12.5)
NEUTROS PCT: 49.3 %
Neutro Abs: 2810 cells/uL (ref 1500–7800)
PLATELETS: 253 10*3/uL (ref 140–400)
RBC: 4.45 10*6/uL (ref 4.20–5.80)
RDW: 12.1 % (ref 11.0–15.0)
TOTAL LYMPHOCYTE: 37.9 %
WBC: 5.7 10*3/uL (ref 3.8–10.8)

## 2018-04-01 LAB — COMPREHENSIVE METABOLIC PANEL
AG Ratio: 1.7 (calc) (ref 1.0–2.5)
ALBUMIN MSPROF: 4.4 g/dL (ref 3.6–5.1)
ALT: 20 U/L (ref 9–46)
AST: 20 U/L (ref 10–35)
Alkaline phosphatase (APISO): 54 U/L (ref 35–144)
BILIRUBIN TOTAL: 0.6 mg/dL (ref 0.2–1.2)
BUN: 15 mg/dL (ref 7–25)
CALCIUM: 9.7 mg/dL (ref 8.6–10.3)
CO2: 27 mmol/L (ref 20–32)
Chloride: 104 mmol/L (ref 98–110)
Creat: 1.13 mg/dL (ref 0.70–1.33)
Globulin: 2.6 g/dL (calc) (ref 1.9–3.7)
Glucose, Bld: 109 mg/dL — ABNORMAL HIGH (ref 65–99)
POTASSIUM: 4.8 mmol/L (ref 3.5–5.3)
SODIUM: 140 mmol/L (ref 135–146)
TOTAL PROTEIN: 7 g/dL (ref 6.1–8.1)

## 2018-04-01 LAB — LIPID PANEL
CHOLESTEROL: 195 mg/dL (ref <200)
HDL: 64 mg/dL (ref >=40)
LDL Cholesterol (Calc): 108 mg/dL (calc) — ABNORMAL HIGH
Non-HDL Cholesterol (Calc): 131 mg/dL (calc) — ABNORMAL HIGH (ref <130)
TRIGLYCERIDES: 120 mg/dL (ref <150)
Total CHOL/HDL Ratio: 3 (calc) (ref <5.0)

## 2018-04-01 LAB — TEST AUTHORIZATION

## 2018-04-01 LAB — PSA: PSA: 2.1 ng/mL (ref <=4.0)

## 2018-04-03 ENCOUNTER — Other Ambulatory Visit: Payer: Self-pay | Admitting: Family Medicine

## 2018-04-03 DIAGNOSIS — R972 Elevated prostate specific antigen [PSA]: Secondary | ICD-10-CM

## 2018-04-10 ENCOUNTER — Ambulatory Visit
Admission: RE | Admit: 2018-04-10 | Discharge: 2018-04-10 | Disposition: A | Discharge status: Home / Self Care | Source: Ambulatory Visit | Attending: Family Medicine | Admitting: Family Medicine

## 2018-04-10 ENCOUNTER — Other Ambulatory Visit: Payer: Self-pay

## 2018-04-10 DIAGNOSIS — R140 Abdominal distension (gaseous): Secondary | ICD-10-CM

## 2018-04-10 DIAGNOSIS — K5909 Other constipation: Secondary | ICD-10-CM

## 2018-04-10 DIAGNOSIS — D175 Benign lipomatous neoplasm of intra-abdominal organs: Secondary | ICD-10-CM

## 2018-04-10 DIAGNOSIS — Z122 Encounter for screening for malignant neoplasm of respiratory organs: Secondary | ICD-10-CM

## 2018-04-10 MED ORDER — IOPAMIDOL (ISOVUE-300) INJECTION 61%
100.0000 mL | Freq: Once | INTRAVENOUS | Status: AC | PRN
  Administered 2018-04-10: 100 mL via INTRAVENOUS

## 2019-02-16 ENCOUNTER — Encounter (INDEPENDENT_AMBULATORY_CARE_PROVIDER_SITE_OTHER): Payer: Self-pay

## 2019-02-25 ENCOUNTER — Other Ambulatory Visit: Payer: Self-pay

## 2019-02-25 ENCOUNTER — Ambulatory Visit (INDEPENDENT_AMBULATORY_CARE_PROVIDER_SITE_OTHER): Admitting: Gastroenterology

## 2019-02-25 ENCOUNTER — Encounter (INDEPENDENT_AMBULATORY_CARE_PROVIDER_SITE_OTHER): Payer: Self-pay | Admitting: Gastroenterology

## 2019-02-25 ENCOUNTER — Telehealth (INDEPENDENT_AMBULATORY_CARE_PROVIDER_SITE_OTHER): Payer: Self-pay | Admitting: Gastroenterology

## 2019-02-25 VITALS — BP 143/98 | HR 90 | Temp 97.5°F | Ht 71.0 in | Wt 268.7 lb

## 2019-02-25 DIAGNOSIS — D175 Benign lipomatous neoplasm of intra-abdominal organs: Secondary | ICD-10-CM | POA: Diagnosis not present

## 2019-02-25 DIAGNOSIS — R635 Abnormal weight gain: Secondary | ICD-10-CM

## 2019-02-25 NOTE — Patient Instructions (Signed)
I will discuss timing of repeat CT w/ Dr Laural Golden and contact you with recommendations

## 2019-02-25 NOTE — Telephone Encounter (Signed)
Willie Coleman-we would like to place a reminder in the recall system for this patient have a CT abdomen pelvis in 3 years from last CT in 2020 (due March 2023).  I did notify him of this recommendation via phone.  Please place in reminder system.

## 2019-02-25 NOTE — Progress Notes (Signed)
Patient profile: Willie Coleman is a 59 y.o. male seen for f/up of lipoma found on colonoscopy   History of Present Illness: Willie Coleman is seen today for follow-up of an abnormal CT scan with a lipoma.  He reports currently he is doing well.  He is having a bowel movement daily denies any constipation or diarrhea.  He denies any rectal bleeding or abdominal pain.  He feels well today.  He has no upper GI symptoms.  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  He is trying to lose weight and increase his exercise level.    Wt Readings from Last 3 Encounters:  02/25/19 268 lb 11.2 oz (121.9 kg)  03/24/18 239 lb (108.4 kg)  02/25/17 236 lb (107 kg)     Last Colonoscopy: 08/2016-lipoma in distal sigmoid colon, otherwise normal.   Last Endoscopy: none prior    Past Medical History:  Past Medical History:  Diagnosis Date  . Hypertension     Problem List: Patient Active Problem List   Diagnosis Date Noted  . Special screening for malignant neoplasms, colon 05/03/2016    Past Surgical History: Past Surgical History:  Procedure Laterality Date  . COLONOSCOPY N/A 08/29/2016   Procedure: COLONOSCOPY;  Surgeon: Rogene Houston, MD;  Location: [facility];  Service: Endoscopy;  Laterality: N/A;  830  . HERNIA REPAIR     inguinal  . TONSILLECTOMY      Allergies: No Known Allergies    Home Medications:  Current Outpatient Medications:  .  amLODipine (NORVASC) 10 MG tablet, Take 1 tablet (10 mg total) by mouth daily., Disp: 90 tablet, Rfl: 3 .  KRILL OIL PO, Take 1 capsule by mouth daily., Disp: , Rfl:  .  Multiple Vitamin (MULTIVITAMIN WITH MINERALS) TABS tablet, Take 1 tablet by mouth daily., Disp: , Rfl:  .  propranolol (INDERAL) 10 MG tablet, TAKE 1 TABLET(10 MG) BY MOUTH  THREE TIMES DAILY (Patient not taking: Reported on 02/25/2019), Disp: 90 tablet, Rfl: 0   Family History: family history is not on file.    Social History:   reports that he quit smoking about 6 years ago. His smoking use included cigarettes. He quit after 30.00 years of use. He has never used smokeless tobacco. He reports current alcohol use. He reports that he does not use drugs.   Review of Systems: Constitutional: Denies weight loss/weight gain  Eyes: No changes in vision. ENT: No oral lesions, sore throat.  GI: see HPI.  Heme/Lymph: No easy bruising.  CV: No chest pain.  GU: No hematuria.  Integumentary: No rashes.  Neuro: No headaches.  Psych: No depression/anxiety.  Endocrine: No heat/cold intolerance.  Allergic/Immunologic: No urticaria.  Resp: No cough, SOB.  Musculoskeletal: No joint swelling.    Physical Examination: BP (!) 143/98 (BP Location: Right Arm, Patient Position: Sitting, Cuff Size: Large)   Pulse 90   Temp (!) 97.5 F (36.4 C) (Temporal)   Ht 5\' 11"  (1.803 m)   Wt 268 lb 11.2 oz (121.9 kg)   BMI 37.48 kg/m  Gen: NAD, alert and oriented x 4 HEENT: PEERLA, EOMI, Neck: supple, no JVD Chest: CTA bilaterally, no wheezes, crackles, or other adventitious sounds CV: RRR, no m/g/c/r Abd: soft, NT, ND, +BS in all  four quadrants; no HSM, guarding, ridigity, or rebound tenderness Ext: no edema, well perfused with 2+ pulses, Skin: no rash or lesions noted on observed skin Lymph: no noted LAD  Data Reviewed:   CT scan a/p 03/2018-IMPRESSION: 1. No acute abnormality within the abdomen and pelvis. 2. Moderate to large volume retained stool throughout the colon, suggesting constipation. 3. 2.8 cm fatty mass within the mid sigmoid colon, consistent with a colonic lipoma. This is stable from previous, and is nonobstructive. 4. Mild aortic atherosclerosis.  08/2016-IMPRESSION: CT scan  2.5 cm fat attenuation mass in mid sigmoid colon, consistent with colonic  lipoma. No evidence of bowel obstruction or other complication.  Aortic atherosclerosis.  Assessment/Plan: Willie Coleman is a 59 y.o. male   Seen today for follow-up of an abnormal CT scan performed 2018 for eval of lipoma, initially planned to have a CT in 2021 but he had a follow-up CT in interim and 2020 when he developed some GI symptoms of abdominal pain.  In retrospect these may have been due to changing jobs and pain may have been more musculoskeletal as was doing a lot of heavy lifting.  He currently has no GI symptoms.  Case discussed w/ Dr Laural Golden -we will plan for repeat CT scan in 3 years, as long as lipoma size has not changed more than 1 cm we will not need further imaging.  I contacted patient after visit with updated recommendation. Also reviewed alarm symptoms to contact us w/ sooner.   Weight gain - briefly discussed, hopes to start loosing weight in future.    Corwyn was seen today for follow-up.  Diagnoses and all orders for this visit:  Lipoma of colon        I personally performed the service, non-incident to. (WP)  Laurine Blazer, [facility]

## 2019-02-26 NOTE — Telephone Encounter (Signed)
3 yr CT noted in recall

## 2019-05-02 ENCOUNTER — Ambulatory Visit: Attending: Internal Medicine

## 2019-05-02 DIAGNOSIS — Z23 Encounter for immunization: Secondary | ICD-10-CM

## 2019-05-02 NOTE — Progress Notes (Signed)
   Covid-19 Vaccination Clinic  Name:  Willie Coleman    MRN: [id number] DOB: 01/11/1961  05/02/2019  Mr. [name] was observed post Covid-19 immunization for 15 minutes without incident. He was provided with Vaccine Information Sheet and instruction to access the V-Safe system.   Mr. [name] was instructed to call 911 with any severe reactions post vaccine: Marland Kitchen Difficulty breathing  . Swelling of face and throat  . A fast heartbeat  . A bad rash all over body  . Dizziness and weakness   Immunizations Administered    Name Date Dose VIS Date Route   Pfizer COVID-19 Vaccine 05/02/2019 12:01 PM 0.3 mL 01/09/2019 Intramuscular   Manufacturer: [place]   Lot: [id number]   [place]: [id number]

## 2019-05-27 ENCOUNTER — Ambulatory Visit: Attending: Internal Medicine

## 2019-05-27 DIAGNOSIS — Z23 Encounter for immunization: Secondary | ICD-10-CM

## 2019-05-27 NOTE — Progress Notes (Signed)
   Covid-19 Vaccination Clinic  Name:  Willie Coleman    MRN: [id number] DOB: July 30, 1960  05/27/2019  [name] was observed post Covid-19 immunization for 15 minutes without incident. He was provided with Vaccine Information Sheet and instruction to access the V-Safe system.   [name] was instructed to call 911 with any severe reactions post vaccine: Marland Kitchen Difficulty breathing  . Swelling of face and throat  . A fast heartbeat  . A bad rash all over body  . Dizziness and weakness   Immunizations Administered    Name Date Dose VIS Date Route   Pfizer COVID-19 Vaccine 05/27/2019  8:56 AM 0.3 mL 03/25/2018 Intramuscular   Manufacturer: [place]   Lot: [id number]   [place]: [id number]

## 2019-05-28 ENCOUNTER — Other Ambulatory Visit: Payer: Self-pay | Admitting: Family Medicine

## 2019-10-16 ENCOUNTER — Other Ambulatory Visit: Payer: Self-pay

## 2019-10-16 ENCOUNTER — Ambulatory Visit (INDEPENDENT_AMBULATORY_CARE_PROVIDER_SITE_OTHER): Admitting: Family Medicine

## 2019-10-16 VITALS — BP 140/72 | HR 86 | Temp 97.9°F | Resp 18 | Wt 272.0 lb

## 2019-10-16 DIAGNOSIS — Z125 Encounter for screening for malignant neoplasm of prostate: Secondary | ICD-10-CM | POA: Diagnosis not present

## 2019-10-16 DIAGNOSIS — R739 Hyperglycemia, unspecified: Secondary | ICD-10-CM

## 2019-10-16 NOTE — Progress Notes (Signed)
Subjective:    Patient ID: Willie Coleman, male    DOB: 08-20-1960, 59 y.o.   MRN: [id number]  HPI Patient is a 59 year old Caucasian male here today for follow-up of his hypertension.  His blood pressure today is borderline at 140/72.  He is taking amlodipine 58 mg daily.  He denies any significant swelling in his leg.  However he has noticed swelling in his left side of his scrotum.  On examination today the left testicle is smaller than the right testicle however there is a poorly defined fullness on the left side of the scrotum.  I believe most likely that this is a hydrocele and is reflective of fluid rather than any type of scrotal mass.  Patient denies any testicular pain or dysuria.  There is no pain over the epididymis.  His colonoscopy is up-to-date.  He has had his Covid vaccine however he is overdue for lab work.  At his last lab work in March 2020, his blood sugar was elevated.  He has gained 30 pounds since that time and therefore I am concerned about prediabetes Past Medical History:  Diagnosis Date  . Hypertension    Past Surgical History:  Procedure Laterality Date  . COLONOSCOPY N/A 08/29/2016   Procedure: COLONOSCOPY;  Surgeon: Rogene Houston, MD;  Location: [facility];  Service: Endoscopy;  Laterality: N/A;  830  . HERNIA REPAIR     inguinal  . TONSILLECTOMY     Current Outpatient Medications on File Prior to Visit  Medication Sig Dispense Refill  . amLODipine (NORVASC) 10 MG tablet TAKE 1 TABLET BY MOUTH DAILY 90 tablet 3  . KRILL OIL PO Take 1 capsule by mouth daily.    . Multiple Vitamin (MULTIVITAMIN WITH MINERALS) TABS tablet Take 1 tablet by mouth daily.     No current facility-administered medications on file prior to visit.   No Known Allergies Social History   Socioeconomic History  . Marital status: Divorced    Spouse name: Not on file  . Number of children: Not on file  . Years of education: Not on file  . Highest education level: Not on file    Occupational History  . Not on file  Tobacco Use  . Smoking status: Former Smoker    Years: 30.00    Types: Cigarettes    Quit date: 09/19/2012    Years since quitting: 7.0  . Smokeless tobacco: Never Used  Vaping Use  . Vaping Use: Never used  Substance and Sexual Activity  . Alcohol use: Yes    Comment: 2 drinks per week  . Drug use: No  . Sexual activity: Not on file    Comment: adopted  Other Topics Concern  . Not on file  Social History Narrative  . Not on file   Social Determinants of Health   Financial Resource Strain:   . Difficulty of Paying Living Expenses: Not on file  Food Insecurity:   . Worried About Charity fundraiser in the Last Year: Not on file  . Ran Out of Food in the Last Year: Not on file  Transportation Needs:   . Lack of Transportation (Medical): Not on file  . Lack of Transportation (Non-Medical): Not on file  Physical Activity:   . Days of Exercise per Week: Not on file  . Minutes of Exercise per Session: Not on file  Stress:   . Feeling of Stress : Not on file  Social Connections:   . Frequency of  Communication with Friends and Family: Not on file  . Frequency of Social Gatherings with Friends and Family: Not on file  . Attends Religious Services: Not on file  . Active Member of Clubs or Organizations: Not on file  . Attends Archivist Meetings: Not on file  . Marital Status: Not on file  Intimate Partner Violence:   . Fear of Current or Ex-Partner: Not on file  . Emotionally Abused: Not on file  . Physically Abused: Not on file  . Sexually Abused: Not on file   No family history on file. Family history is unknown. He is adopted and has no knowledge of his biologic parents   Review of Systems  All other systems reviewed and are negative.      Objective:   Physical Exam Vitals reviewed.  Constitutional:      General: He is not in acute distress.    Appearance: He is well-developed. He is not diaphoretic.  HENT:      Head: Normocephalic and atraumatic.     Mouth/Throat:     Pharynx: No oropharyngeal exudate.  Eyes:     General: No scleral icterus.       Right eye: No discharge.        Left eye: No discharge.     Conjunctiva/sclera: Conjunctivae normal.     Pupils: Pupils are equal, round, and reactive to light.  Neck:     Thyroid: No thyromegaly.     Vascular: No JVD.     Trachea: No tracheal deviation.  Cardiovascular:     Rate and Rhythm: Normal rate and regular rhythm.     Heart sounds: Normal heart sounds. No murmur heard.  No friction rub. No gallop.   Pulmonary:     Effort: Pulmonary effort is normal. No respiratory distress.     Breath sounds: Normal breath sounds. No stridor. No wheezing or rales.  Chest:     Chest wall: No tenderness.  Abdominal:     General: Bowel sounds are normal. There is no distension.     Palpations: Abdomen is soft.     Tenderness: There is no abdominal tenderness. There is no guarding or rebound.  Genitourinary:    Penis: Normal.      Testes:        Left: Testicular hydrocele present. Mass not present.  Musculoskeletal:        General: No tenderness or deformity. Normal range of motion.  Skin:    General: Skin is warm.     Coloration: Skin is not pale.     Findings: No erythema or rash.  Neurological:     Mental Status: He is alert and oriented to person, place, and time.     Cranial Nerves: No cranial nerve deficit.     Motor: No abnormal muscle tone.     Coordination: Coordination normal.     Deep Tendon Reflexes: Reflexes are normal and symmetric.  Psychiatric:        Behavior: Behavior normal.        Thought Content: Thought content normal.        Judgment: Judgment normal.           Assessment & Plan:  Elevated blood sugar - Plan: CBC with Differential/Platelet, COMPLETE METABOLIC PANEL WITH GFR, Lipid panel, Hemoglobin A1c  Prostate cancer screening - Plan: PSA  Blood pressure is borderline controlled on amlodipine.  I have  recommended diet exercise and weight loss.  I would like to see the  patient try to lose 20 pounds over the next 6 months.  Return fasting for a CBC, CMP, lipid panel, and a hemoglobin A1c.  His last blood sugar was elevated and therefore I want to see if the patient has progressed to overt diabetes.  Screen for prostate cancer with a PSA.  I believe the fullness on the left side of the scrotum is likely a hydrocele.  Offered the patient an ultrasound of the scrotum to evaluate further but he politely declined.  If it changes in any way or enlarges I would recommend an ultrasound.  I offered the patient a flu shot but he politely declined today

## 2019-10-26 ENCOUNTER — Encounter: Payer: Self-pay | Admitting: Family Medicine

## 2019-11-12 ENCOUNTER — Other Ambulatory Visit: Payer: Self-pay

## 2019-11-12 ENCOUNTER — Other Ambulatory Visit

## 2019-11-12 LAB — CBC WITH DIFFERENTIAL/PLATELET
Basophils Absolute: 50 cells/uL (ref 0–200)
Basophils Relative: 0.8 %
Eosinophils Relative: 3.5 %
MCHC: 35 g/dL (ref 32.0–36.0)
MCV: 93.2 fL (ref 80.0–100.0)
Neutro Abs: 3465 cells/uL (ref 1500–7800)

## 2019-11-13 LAB — CBC WITH DIFFERENTIAL/PLATELET
Absolute Monocytes: 479 cells/uL (ref 200–950)
Eosinophils Absolute: 221 cells/uL (ref 15–500)
HCT: 46.3 % (ref 38.5–50.0)
Hemoglobin: 16.2 g/dL (ref 13.2–17.1)
Lymphs Abs: 2085 cells/uL (ref 850–3900)
MCH: 32.6 pg (ref 27.0–33.0)
MPV: 10.4 fL (ref 7.5–12.5)
Monocytes Relative: 7.6 %
Neutrophils Relative %: 55 %
Platelets: 236 10*3/uL (ref 140–400)
RBC: 4.97 10*6/uL (ref 4.20–5.80)
RDW: 12.4 % (ref 11.0–15.0)
Total Lymphocyte: 33.1 %
WBC: 6.3 10*3/uL (ref 3.8–10.8)

## 2019-11-13 LAB — COMPLETE METABOLIC PANEL WITH GFR
AG Ratio: 1.8 (calc) (ref 1.0–2.5)
ALT: 38 U/L (ref 9–46)
AST: 23 U/L (ref 10–35)
Albumin: 4.6 g/dL (ref 3.6–5.1)
Alkaline phosphatase (APISO): 60 U/L (ref 35–144)
BUN: 14 mg/dL (ref 7–25)
CO2: 27 mmol/L (ref 20–32)
Calcium: 10.1 mg/dL (ref 8.6–10.3)
Chloride: 102 mmol/L (ref 98–110)
Creat: 1.25 mg/dL (ref 0.70–1.33)
GFR, Est African American: 73 mL/min/{1.73_m2} (ref >=60)
GFR, Est Non African American: 63 mL/min/{1.73_m2} (ref >=60)
Globulin: 2.6 g/dL (calc) (ref 1.9–3.7)
Glucose, Bld: 121 mg/dL — ABNORMAL HIGH (ref 65–99)
Potassium: 4.9 mmol/L (ref 3.5–5.3)
Sodium: 138 mmol/L (ref 135–146)
Total Bilirubin: 0.7 mg/dL (ref 0.2–1.2)
Total Protein: 7.2 g/dL (ref 6.1–8.1)

## 2019-11-13 LAB — LIPID PANEL
Cholesterol: 214 mg/dL — ABNORMAL HIGH (ref <200)
HDL: 49 mg/dL (ref >=40)
LDL Cholesterol (Calc): 138 mg/dL (calc) — ABNORMAL HIGH
Non-HDL Cholesterol (Calc): 165 mg/dL (calc) — ABNORMAL HIGH (ref <130)
Total CHOL/HDL Ratio: 4.4 (calc) (ref <5.0)
Triglycerides: 138 mg/dL (ref <150)

## 2019-11-13 LAB — PSA: PSA: 1.74 ng/mL (ref <=4.0)

## 2019-11-13 LAB — HEMOGLOBIN A1C
Hgb A1c MFr Bld: 5.8 % of total Hgb — ABNORMAL HIGH (ref <5.7)
Mean Plasma Glucose: 120 (calc)
eAG (mmol/L): 6.6 (calc)

## 2019-11-18 ENCOUNTER — Other Ambulatory Visit: Payer: Self-pay

## 2019-11-18 DIAGNOSIS — E7800 Pure hypercholesterolemia, unspecified: Secondary | ICD-10-CM

## 2019-11-18 MED ORDER — ROSUVASTATIN CALCIUM 10 MG PO TABS: 10.0000 mg | ORAL_TABLET | Freq: Every day | ORAL | 3 refills | Status: DC

## 2020-07-27 ENCOUNTER — Other Ambulatory Visit: Payer: Self-pay | Admitting: Family Medicine

## 2020-08-17 ENCOUNTER — Encounter: Payer: Self-pay | Admitting: Registered Nurse

## 2020-08-17 ENCOUNTER — Telehealth: Payer: Self-pay | Admitting: Registered Nurse

## 2020-08-17 NOTE — Telephone Encounter (Signed)
Patient reported had covid vaccination moderna booster yesterday at [facility]site [facility]  Feeling under the weather today elected to stay home/work remote.  Previous vaccinations day after similar symptoms.  Patient notified to contact me if new or worsening symptoms [phone].  Eat regular meals, hydrate, nap today if fatigued.  Discussed body is working hard in response to vaccination yesterday and his symptoms common after vaccination.  Patient A&Ox3 spoke full sentences without difficulty  no cough/nasal sniffing/congestion or throat clearing noted during 2 minute telephone call.  Patient cleared to return onsite tomorrow if feeling well no quarantine or covid testing indicated at this time.  If new or worsening symptoms will re-evaluate.  Patient verbalized understanding information/instructions, agreed with plan of care and had no further questions at this time.

## 2020-08-21 NOTE — Telephone Encounter (Signed)
Patient contacted via telephone stated RN Hildred Alamin contacted him the next day and he had returned to work as expected.  Stated symptoms a little more intense with moderna compared to pfizer vaccine but resolved in 24 hours and no further questions or concerns.  Patient feeling well A&Ox3 spoke full sentences without difficulty no cough/congestion or throat clearing noted during 2 minute telephone call encounter closed.

## 2020-11-15 ENCOUNTER — Encounter: Payer: Self-pay | Admitting: Registered Nurse

## 2020-11-15 ENCOUNTER — Other Ambulatory Visit: Payer: Self-pay

## 2020-11-15 ENCOUNTER — Ambulatory Visit: Payer: Self-pay | Admitting: Registered Nurse

## 2020-11-15 VITALS — BP 160/110 | HR 74 | Temp 98.7°F

## 2020-11-15 DIAGNOSIS — R030 Elevated blood-pressure reading, without diagnosis of hypertension: Secondary | ICD-10-CM

## 2020-11-15 DIAGNOSIS — H6983 Other specified disorders of Eustachian tube, bilateral: Secondary | ICD-10-CM

## 2020-11-15 DIAGNOSIS — H60502 Unspecified acute noninfective otitis externa, left ear: Secondary | ICD-10-CM

## 2020-11-15 MED ORDER — AMOXICILLIN-POT CLAVULANATE 875-125 MG PO TABS: 1.0000 | ORAL_TABLET | Freq: Two times a day (BID) | ORAL | 0 refills | Status: DC

## 2020-11-15 MED ORDER — CIPROFLOXACIN-FLUOCINOLONE PF 0.3-0.025 % OT SOLN: 0.2500 mL | Freq: Two times a day (BID) | OTIC | 0 refills | Status: AC

## 2020-11-15 NOTE — Progress Notes (Signed)
Subjective:    Patient ID: Willie Coleman, male    DOB: 1960/12/07, 60 y.o.   MRN: [id number]  60y/o caucasian male established patient here for left ear pain.  "I think I have ear infection external."  Patient denied history of recurrent ear infections.  Denied fever/chills/discharge.  Patient also reports hearing muffled in left ear.       Review of Systems  Constitutional:  Negative for activity change, appetite change, chills, diaphoresis, fatigue and fever.  HENT:  Positive for ear pain and hearing loss. Negative for congestion, ear discharge, sinus pressure, sinus pain, sneezing, sore throat, tinnitus, trouble swallowing and voice change.   Eyes:  Negative for photophobia and visual disturbance.  Respiratory:  Negative for cough, shortness of breath, wheezing and stridor.   Cardiovascular:  Negative for chest pain.  Gastrointestinal:  Negative for diarrhea and vomiting.  Endocrine: Negative for cold intolerance and heat intolerance.  Genitourinary:  Negative for difficulty urinating.  Musculoskeletal:  Negative for back pain, gait problem, myalgias, neck pain and neck stiffness.  Skin:  Negative for rash.  Allergic/Immunologic: Negative for food allergies.  Neurological:  Negative for dizziness, tremors, seizures, syncope, facial asymmetry, speech difficulty, weakness, light-headedness, numbness and headaches.  Hematological:  Negative for adenopathy. Does not bruise/bleed easily.  Psychiatric/Behavioral:  Negative for agitation, confusion and sleep disturbance.       Objective:   Physical Exam Vitals and nursing note reviewed.  Constitutional:      General: He is awake. He is not in acute distress.    Appearance: Normal appearance. He is well-developed and well-groomed. He is obese. He is not ill-appearing, toxic-appearing or diaphoretic.  HENT:     Head: Normocephalic and atraumatic.     Jaw: There is normal jaw occlusion. No trismus.     Salivary Glands: Right salivary  gland is not diffusely enlarged or tender. Left salivary gland is not diffusely enlarged or tender.     Right Ear: Hearing and external ear normal. No laceration, drainage, swelling or tenderness. A middle ear effusion is present. There is no impacted cerumen. No mastoid tenderness. No PE tube. No hemotympanum. Tympanic membrane is bulging. Tympanic membrane is not injected, scarred, perforated, erythematous or retracted.     Left Ear: Hearing and external ear normal. Drainage, swelling and tenderness present. No laceration. A middle ear effusion is present. There is no impacted cerumen. No mastoid tenderness. No hemotympanum. Tympanic membrane is bulging. Tympanic membrane is not injected or scarred.     Ears:     Comments: Right TM air fluid level opaque without erythema; no debris in right auditory canal but nodules ?osteomas or ear canal exostosis noted 6 oclock from canal opening to TM; left auditory canal with 2+4 swelling/tenderness/erythema and debris; air fluid level noted left TM slight opacity centrally.  Unable to visualize left TM entire due to patient discomfort during exam with otoscope and canal swelling; patient denied mastoid/pre- or post-auricular/tragal/pinna pain with palpation    Nose: No nasal deformity, septal deviation, signs of injury, laceration, nasal tenderness, mucosal edema, congestion or rhinorrhea.     Right Turbinates: Not enlarged, swollen or pale.     Left Turbinates: Not enlarged, swollen or pale.     Right Sinus: No maxillary sinus tenderness or frontal sinus tenderness.     Left Sinus: No maxillary sinus tenderness or frontal sinus tenderness.     Mouth/Throat:     Lips: Pink. No lesions.     Mouth: Mucous membranes are moist.  Mucous membranes are not pale, not dry and not cyanotic. No lacerations, oral lesions or angioedema.     Dentition: No dental abscesses or gum lesions.     Tongue: No lesions. Tongue does not deviate from midline.     Palate: No mass and  lesions.     Pharynx: Oropharynx is clear. Uvula midline. No pharyngeal swelling, oropharyngeal exudate, posterior oropharyngeal erythema or uvula swelling.     Tonsils: No tonsillar exudate or tonsillar abscesses.  Eyes:     General: Lids are normal. Vision grossly intact. Gaze aligned appropriately. Allergic shiner present. No scleral icterus.       Right eye: No foreign body, discharge or hordeolum.        Left eye: No foreign body, discharge or hordeolum.     Extraocular Movements: Extraocular movements intact.     Right eye: Normal extraocular motion and no nystagmus.     Left eye: Normal extraocular motion and no nystagmus.     Conjunctiva/sclera: Conjunctivae normal.     Right eye: Right conjunctiva is not injected. No chemosis, exudate or hemorrhage.    Left eye: Left conjunctiva is not injected. No chemosis, exudate or hemorrhage.    Pupils: Pupils are equal, round, and reactive to light. Pupils are equal.     Right eye: Pupil is round and reactive.     Left eye: Pupil is round and reactive.  Neck:     Trachea: Trachea and phonation normal. No tracheal tenderness or tracheal deviation.  Cardiovascular:     Rate and Rhythm: Normal rate and regular rhythm.     Chest Wall: PMI is not displaced.     Pulses:          Radial pulses are 2+ on the right side and 2+ on the left side.     Heart sounds: Normal heart sounds, S1 normal and S2 normal. No murmur heard.   No friction rub. No gallop.  Pulmonary:     Effort: Pulmonary effort is normal. No respiratory distress.     Breath sounds: Normal breath sounds and air entry. No stridor or transmitted upper airway sounds. No decreased breath sounds, wheezing, rhonchi or rales.     Comments: Spoke full sentences without difficulty; no cough observed in clinic Abdominal:     General: There is no distension.     Palpations: Abdomen is soft.  Musculoskeletal:        General: No tenderness. Normal range of motion.     Cervical back: Normal  range of motion and neck supple. No edema, erythema, signs of trauma, rigidity, torticollis or crepitus. No pain with movement or muscular tenderness. Normal range of motion.  Lymphadenopathy:     Head:     Right side of head: No submental, submandibular, tonsillar, preauricular, posterior auricular or occipital adenopathy.     Left side of head: No submental, submandibular, tonsillar, preauricular, posterior auricular or occipital adenopathy.     Cervical: No cervical adenopathy.     Right cervical: No superficial, deep or posterior cervical adenopathy.    Left cervical: No superficial, deep or posterior cervical adenopathy.  Skin:    General: Skin is warm and dry.     Capillary Refill: Capillary refill takes less than 2 seconds.     Coloration: Skin is not jaundiced or pale.     Findings: No abrasion, bruising, burn, ecchymosis, erythema, laceration, lesion, petechiae or rash.     Nails: There is no clubbing.  Neurological:  General: No focal deficit present.     Mental Status: He is alert and oriented to person, place, and time. Mental status is at baseline.     GCS: GCS eye subscore is 4. GCS verbal subscore is 5. GCS motor subscore is 6.     Cranial Nerves: Cranial nerves are intact. No cranial nerve deficit, dysarthria or facial asymmetry.     Sensory: Sensation is intact. No sensory deficit.     Motor: Motor function is intact. No weakness, tremor, atrophy, abnormal muscle tone or seizure activity.     Coordination: Coordination is intact. Coordination normal.     Gait: Gait is intact. Gait normal.     Comments: Gait sure and steady in clinic; in/out of chair and on/off exam table without difficulty; bilateral hand grasp equal 5/5  Psychiatric:        Attention and Perception: Attention and perception normal.        Mood and Affect: Mood and affect normal.        Speech: Speech normal.        Behavior: Behavior normal. Behavior is cooperative.        Thought Content: Thought  content normal.        Cognition and Memory: Cognition and memory normal.        Judgment: Judgment normal.          Assessment & Plan:  A-left otitis externa and bilateral eustachian tube dysfunction, elevated blood pressure  P-Left otitis externa start ciprofloxacin dexamethasone 4 gtts left ear BID x 7 days #3.21ml RF0  if no improvement with use x 2 days pick up and start augmentin 875mg  po BID x 10 days #20 RF0 electronic Rx sent to patient pharmacy of choice.  May use tylenol 1000mg  po QID prn pain.  I do not recommend NSAID at this time due to elevated blood pressure.  If no relief of symptoms with 48 hours use of ear drops or if right ear pain starts begin Augmentin 875mg  po BID x 10 days   No evidence of invasive bacterial infection, non toxic and well hydrated.  This is most likely self limiting viral infection.  I do not see where any further testing or imaging is necessary at this time.   I will suggest supportive care, rest, good hygiene and encourage the patient to take adequate fluids.  The patient is to return to clinic or EMERGENCY ROOM if symptoms worsen or change significantly e.g. ear pain, fever, purulent discharge from ears or bleeding.  Exitcare handout on otitis media externa printed and given to patient.   Patient verbalized agreement and understanding of treatment plan and had no further questions at this time.      No evidence of invasive bacterial infection, non toxic and well hydrated.  I do not see where any further testing or imaging is necessary at this time.   I will suggest supportive care, rest, good hygiene and encourage the patient to take adequate fluids.  The patient is to return to clinic or EMERGENCY ROOM if symptoms worsen or change significantly e.g. ear pain, fever, purulent discharge from ears or bleeding.  Exitcare handout eustachian tube dysfunction printed and given to patient.  Discussed with patient post nasal drip irritates throat/causes swelling blocks  eustachian tubes from draining and fluid fills up middle ear.  Bacteria/viruses can grow in fluid and with moving head tube compressed and increases pressure in tube/ear worsening pain.  Studies show will take 30 days for  fluid to resolve after post nasal drip controlled with nasal steroid/antihistamine. Antibiotics and steroids do not speed up fluid removal.  Patient verbalized agreement and understanding of treatment plan and had no further questions at this time.   Patient may use normal saline nasal spray 2 sprays each nostril q2h wa as needed. flonase 72mcg 1 spray each nostril BID OTC.  Patient denied personal or family history of ENT cancer.  OTC antihistamine of choice claritin/zyrtec 10mg  po daily.  Avoid triggers if possible.  Shower prior to bedtime if exposed to triggers.  If allergic dust/dust mites recommend mattress/pillow covers/encasements; washing linens, vacuuming, sweeping, dusting weekly.  Call or return to clinic as needed if these symptoms worsen or fail to improve as anticipated.  Patient verbalized understanding of instructions, agreed with plan of care and had no further questions at this time.  P2:  Avoidance and hand washing.   ER if chest pain, worst headache of life, dyspnea or visual changes for re-evaluation.  Will see PCM for follow up  May see RN Hildred Alamin for blood pressure rechecks.  Patient verbalized understanding information/instructions, agreed with plan of care and had no further questions at this time.

## 2020-11-15 NOTE — Patient Instructions (Signed)
Otitis Externa Otitis externa is an infection of the outer ear canal. The outer ear canal is the area between the outside of the ear and the eardrum. Otitis externa is sometimes called swimmer's ear. What are the causes? Common causes of this condition include: Swimming in dirty water. Moisture in the ear. An injury to the inside of the ear. An object stuck in the ear. A cut or scrape on the outside of the ear or in the ear canal. What increases the risk? You are more likely to develop this condition if you go swimming often. What are the signs or symptoms? The first symptom of this condition is often itching in the ear. Later symptoms of the condition include: Swelling of the ear. Redness in the ear. Ear pain. The pain may get worse when you pull on your ear. Pus coming from the ear. How is this diagnosed? This condition may be diagnosed by examining the ear and testing fluid from the ear for bacteria and funguses. How is this treated? This condition may be treated with: Antibiotic ear drops. These are often given for 10-14 days. Medicines to reduce itching and swelling. Follow these instructions at home: If you were prescribed antibiotic ear drops, use them as told by your health care provider. Do not stop using the antibiotic even if you start to feel better. Take over-the-counter and prescription medicines only as told by your health care provider. Avoid getting water in your ears as told by your health care provider. This may include avoiding swimming or water sports for a few days. Keep all follow-up visits. This is important. How is this prevented? Keep your ears dry. Use the corner of a towel to dry your ears after you swim or bathe. Avoid scratching or putting things in your ear. Doing these things can damage the ear canal or remove the protective wax that lines it, which makes it easier for bacteria and funguses to grow. Avoid swimming in lakes, polluted water, or swimming  pools that may not have enough chlorine. Contact a health care provider if: You have a fever. Your ear is still red, swollen, painful, or draining pus after 3 days. Your redness, swelling, or pain gets worse. You have a severe headache. Get help right away if: You have redness, swelling, and pain or tenderness in the area behind your ear. Summary Otitis externa is an infection of the outer ear canal. Common causes include swimming in dirty water, moisture in the ear, or a cut or scrape in the ear. Symptoms include pain, redness, and swelling of the ear canal. If you were prescribed antibiotic ear drops, use them as told by your health care provider. Do not stop using the antibiotic even if you start to feel better. This information is not intended to replace advice given to you by your health care provider. Make sure you discuss any questions you have with your health care provider. Document Revised: 03/30/2020 Document Reviewed: 03/30/2020 Elsevier Patient Education  [place]. Otitis Media, Adult Otitis media occurs when there is inflammation and fluid in the middle ear with signs and symptoms of an acute infection. The middle ear is a part of the ear that contains bones for hearing as well as air that helps send sounds to the brain. When infected fluid builds up in this space, it causes pressure and can lead to an ear infection. The eustachian tube connects the middle ear to the back of the nose (nasopharynx) and normally allows air  into the middle ear. If the eustachian tube becomes blocked, fluid can build up and become infected. What are the causes? This condition is caused by a blockage in the eustachian tube. This can be caused by mucus or by swelling of the tube. Problems that can cause a blockage include: A cold or other upper respiratory infection. Allergies. An irritant, such as tobacco smoke. Enlarged adenoids. The adenoids are areas of soft tissue located high in the  back of the throat, behind the nose and the roof of the mouth. They are part of the body's defense system (immune system). A mass in the nasopharynx. Damage to the ear caused by pressure changes (barotrauma). What increases the risk? You are more likely to develop this condition if you: Smoke or are exposed to tobacco smoke. Have an opening in the roof of your mouth (cleft palate). Have gastroesophageal reflux. Have an immune system disorder. What are the signs or symptoms? Symptoms of this condition include: Ear pain. Fever. Decreased hearing. Tiredness (lethargy). Fluid leaking from the ear, if the eardrum is ruptured or has burst. Ringing in the ear. How is this diagnosed? This condition is diagnosed with a physical exam. During the exam, your health care provider will use an instrument called an otoscope to look in your ear and check for redness, swelling, and fluid. He or she will also ask about your symptoms. Your health care provider may also order tests, such as: A pneumatic otoscopy. This is a test to check the movement of the eardrum. It is done by squeezing a small amount of air into the ear. A tympanogram. This is a test that shows how well the eardrum moves in response to air pressure in the ear canal. It provides a graph for your health care provider to review. How is this treated? This condition can go away on its own within 3-5 days. But if the condition is caused by a bacterial infection and does not go away on its own, or if it keeps coming back, your health care provider may: Prescribe antibiotic medicine to treat the infection. Prescribe or recommend medicines to control pain. Follow these instructions at home: Take over-the-counter and prescription medicines only as told by your health care provider. If you were prescribed an antibiotic medicine, take it as told by your health care provider. Do not stop taking the antibiotic even if you start to feel better. Keep all  follow-up visits. This is important. Contact a health care provider if: You have bleeding from your nose. There is a lump on your neck. You are not feeling better in 5 days. You feel worse instead of better. Get help right away if: You have severe pain that is not controlled with medicine. You have swelling, redness, or pain around your ear. You have stiffness in your neck. A part of your face is not moving (paralyzed). The bone behind your ear (mastoid bone) is tender when you touch it. You develop a severe headache. Summary Otitis media is redness, soreness, and swelling of the middle ear, usually resulting in pain and decreased hearing. This condition can go away on its own within 3-5 days. If the problem does not go away in 3-5 days, your health care provider may give you medicines to treat the infection. If you were prescribed an antibiotic medicine, take it as told by your health care provider. Follow all instructions that were given to you by your health care provider. This information is not intended to replace advice  given to you by your health care provider. Make sure you discuss any questions you have with your health care provider. Document Revised: 04/25/2020 Document Reviewed: 04/25/2020 Elsevier Patient Education  [place].

## 2020-11-30 ENCOUNTER — Telehealth: Payer: Self-pay | Admitting: Registered Nurse

## 2020-11-30 ENCOUNTER — Encounter: Payer: Self-pay | Admitting: Registered Nurse

## 2020-11-30 DIAGNOSIS — H6983 Other specified disorders of Eustachian tube, bilateral: Secondary | ICD-10-CM

## 2020-11-30 NOTE — Telephone Encounter (Signed)
Patient contacted to see if all symptoms resolved.  Patient reported pharmacy did not have ear drops so took augmentin 875mg  po BID x 10 days.  Pain resolved left ear but still sometimes having sounds like water in ear.  Otic exam conducted and canal inflammation/swelling/erythema resolved left but still air fluid level noted TM without erythema and intact. Discussed fluid in middle ear should clear up over the next month.  If new or worsening symptoms follow up for re-evaluation. Right auditory canal still with possible exostosis.  Patient denied cold water swimming history or pain/discharge.  Discussed with patient consider follow up with ENT for evaluation of right ear canal.  Patient verbalized understanding information/instructions and had no further questions at this time.

## 2021-03-22 ENCOUNTER — Encounter (INDEPENDENT_AMBULATORY_CARE_PROVIDER_SITE_OTHER): Payer: Self-pay | Admitting: *Deleted

## 2021-04-18 ENCOUNTER — Other Ambulatory Visit: Payer: Self-pay

## 2021-04-18 ENCOUNTER — Ambulatory Visit (INDEPENDENT_AMBULATORY_CARE_PROVIDER_SITE_OTHER): Admitting: Family Medicine

## 2021-04-18 VITALS — BP 152/82 | HR 89 | Temp 97.6°F | Ht 71.0 in | Wt 303.2 lb

## 2021-04-18 DIAGNOSIS — E7800 Pure hypercholesterolemia, unspecified: Secondary | ICD-10-CM

## 2021-04-18 DIAGNOSIS — Z0001 Encounter for general adult medical examination with abnormal findings: Secondary | ICD-10-CM

## 2021-04-18 DIAGNOSIS — N433 Hydrocele, unspecified: Secondary | ICD-10-CM

## 2021-04-18 DIAGNOSIS — Z125 Encounter for screening for malignant neoplasm of prostate: Secondary | ICD-10-CM | POA: Diagnosis not present

## 2021-04-18 DIAGNOSIS — R7303 Prediabetes: Secondary | ICD-10-CM

## 2021-04-18 DIAGNOSIS — Z122 Encounter for screening for malignant neoplasm of respiratory organs: Secondary | ICD-10-CM

## 2021-04-18 DIAGNOSIS — G4710 Hypersomnia, unspecified: Secondary | ICD-10-CM

## 2021-04-18 DIAGNOSIS — Z87891 Personal history of nicotine dependence: Secondary | ICD-10-CM

## 2021-04-18 DIAGNOSIS — Z0000 Encounter for general adult medical examination without abnormal findings: Secondary | ICD-10-CM

## 2021-04-18 NOTE — Progress Notes (Signed)
? ?Subjective:  ? ? Patient ID: Willie Coleman, male    DOB: 1960-06-03, 61 y.o.   MRN: [id number] ? ?HPI ?Patient is a very pleasant 61 year old Caucasian gentleman who presents today for complete physical exam.  Since I last saw him, he has gained weight he is up to 303 pounds with a BMI of 42.  Since the weight gain, the patient feels extremely tired.  He reports minimal fall.  He occasionally wakes himself up having stopped bleeding.  He states that he has a headache every morning.  He never feels rested despite getting plenty of sleep based on time in bed.  He denies falling asleep driving.  He is try to start exercising but his weight and his knee pain make it difficult.  His blood pressure here today is elevated however he is checking his blood pressure at home and states that he never sees his systolic blood pressure above 545, his diastolic blood pressure is always below 90.  He is concerned that he has become a diabetic.  He was a prediabetic at his last physical but that was prior to the weight gain.  He denies any polyuria polydipsia blurred vision however.  His colonoscopy is up-to-date.  He is due for prostate cancer screening.  He is also due for lung cancer screening.  He has quit smoking 10 years ago but is still within the screening window.  His hydrocele in his scrotum has also gotten much bigger.  He would like to see a urologist to have this repaired. ?Past Medical History:  ?Diagnosis Date  ? Hypertension   ? ?Past Surgical History:  ?Procedure Laterality Date  ? COLONOSCOPY N/A 08/29/2016  ? Procedure: COLONOSCOPY;  Surgeon: Rogene Houston, MD;  [facility];  Service: Endoscopy;  Laterality: N/A;  830  ? HERNIA REPAIR    ? inguinal  ? TONSILLECTOMY    ? ?Current Outpatient Medications on File Prior to Visit  ?Medication Sig Dispense Refill  ? amLODipine (NORVASC) 10 MG tablet TAKE 1 TABLET BY MOUTH DAILY 90 tablet 3  ? KRILL OIL PO Take 1 capsule by mouth daily.    ? Multiple Vitamin  (MULTIVITAMIN WITH MINERALS) TABS tablet Take 1 tablet by mouth daily.    ? rosuvastatin (CRESTOR) 10 MG tablet Take 1 tablet (10 mg total) by mouth daily. (Patient not taking: Reported on 11/15/2020) 90 tablet 3  ? ?No current facility-administered medications on file prior to visit.  ? ?No Known Allergies ?Social History  ? ?Socioeconomic History  ? Marital status: Divorced  ?  Spouse name: Not on file  ? Number of children: Not on file  ? Years of education: Not on file  ? Highest education level: Not on file  ?Occupational History  ? Not on file  ?Tobacco Use  ? Smoking status: Former  ?  Years: 30.00  ?  Types: Cigarettes  ?  Quit date: 09/19/2012  ?  Years since quitting: 8.5  ? Smokeless tobacco: Never  ?Vaping Use  ? Vaping Use: Never used  ?Substance and Sexual Activity  ? Alcohol use: Yes  ?  Comment: 2 drinks per week  ? Drug use: No  ? Sexual activity: Not on file  ?  Comment: adopted  ?Other Topics Concern  ? Not on file  ?Social History Narrative  ? Not on file  ? ?Social Determinants of Health  ? ?Financial Resource Strain: Not on file  ?Food Insecurity: Not on file  ?Transportation Needs:  Not on file  ?Physical Activity: Not on file  ?Stress: Not on file  ?Social Connections: Not on file  ?Intimate Partner Violence: Not on file  ? ?No family history on file. ?Family history is unknown. He is adopted and has no knowledge of his biologic parents ? ? ?Review of Systems  ?All other systems reviewed and are negative. ? ?   ?Objective:  ? Physical Exam ?Vitals reviewed.  ?Constitutional:   ?   General: He is not in acute distress. ?   Appearance: He is well-developed. He is not diaphoretic.  ?HENT:  ?   Head: Normocephalic and atraumatic.  ?   Right Ear: External ear normal.  ?   Left Ear: External ear normal.  ?   Nose: Nose normal.  ?   Mouth/Throat:  ?   Pharynx: No oropharyngeal exudate.  ?Eyes:  ?   General: No scleral icterus.    ?   Right eye: No discharge.     ?   Left eye: No discharge.  ?    Conjunctiva/sclera: Conjunctivae normal.  ?   Pupils: Pupils are equal, round, and reactive to light.  ?Neck:  ?   Thyroid: No thyromegaly.  ?   Vascular: No JVD.  ?   Trachea: No tracheal deviation.  ?Cardiovascular:  ?   Rate and Rhythm: Normal rate and regular rhythm.  ?   Heart sounds: Normal heart sounds. No murmur heard. ?  No friction rub. No gallop.  ?Pulmonary:  ?   Effort: Pulmonary effort is normal. No respiratory distress.  ?   Breath sounds: Normal breath sounds. No stridor. No wheezing or rales.  ?Chest:  ?   Chest wall: No tenderness.  ?Abdominal:  ?   General: Bowel sounds are normal. There is no distension.  ?   Palpations: Abdomen is soft.  ?   Tenderness: There is no abdominal tenderness. There is no guarding or rebound.  ?Genitourinary: ?   Penis: Normal.   ?   Testes:     ?   Left: Testicular hydrocele present.  ?   Epididymis:  ?   Right: Normal.  ?   Left: Normal.  ?Musculoskeletal:     ?   General: No tenderness or deformity. Normal range of motion.  ?   Cervical back: Normal range of motion and neck supple.  ?Lymphadenopathy:  ?   Cervical: No cervical adenopathy.  ?Skin: ?   General: Skin is warm.  ?   Coloration: Skin is not pale.  ?   Findings: No erythema or rash.  ?Neurological:  ?   Mental Status: He is alert and oriented to person, place, and time.  ?   Cranial Nerves: No cranial nerve deficit.  ?   Motor: No abnormal muscle tone.  ?   Coordination: Coordination normal.  ?   Deep Tendon Reflexes: Reflexes are normal and symmetric.  ?Psychiatric:     ?   Behavior: Behavior normal.     ?   Thought Content: Thought content normal.     ?   Judgment: Judgment normal.  ? ? ? ? ? ?   ?Assessment & Plan:  ?General medical exam ? ?Elevated cholesterol ? ?Prostate cancer screening - Plan: PSA ? ?Prediabetes - Plan: CBC with Differential/Platelet, Lipid panel, COMPLETE METABOLIC PANEL WITH GFR, Hemoglobin A1c ? ?Screening for lung cancer - Plan: CT CHEST LUNG CA SCREEN LOW DOSE W/O  CM ? ?Ex-smoker - Plan: CT CHEST LUNG CA SCREEN  LOW DOSE W/O CM ? ?Hypersomnia - Plan: Ambulatory referral to Sleep Studies ? ?Hydrocele, unspecified hydrocele type - Plan: Ambulatory referral to Urology ?Return fasting for CBC CMP lipid panel and PSA.  Patient's colon cancer screening is up-to-date.  Based on his history of prediabetes I would also like to check an A1c.  If elevated I would recommend trying Ozempic to treat prediabetes and also facilitate weight loss.  Of asked the patient to check his blood pressure frequently over the next week and report the values to me.  If consistently greater than 140/90 I will add valsartan.  Schedule the patient for lung cancer screening.  Schedule the patient to see urology for a large hydrocele in his left scrotum.  Schedule patient to meet with a neurologist to discuss a sleep study because I am concerned his symptoms consistent with obstructive sleep apnea.  Recommended an hour a day 5 days a week of aerobic exercise such as riding a stationary bike.  Recommended limiting his carbohydrates to less than 45 g per meal.  Recommended limiting his sodium to less than 2000 mg a day ?

## 2021-04-19 DIAGNOSIS — R030 Elevated blood-pressure reading, without diagnosis of hypertension: Secondary | ICD-10-CM | POA: Insufficient documentation

## 2021-04-19 DIAGNOSIS — F4321 Adjustment disorder with depressed mood: Secondary | ICD-10-CM | POA: Insufficient documentation

## 2021-04-19 DIAGNOSIS — E663 Overweight: Secondary | ICD-10-CM | POA: Insufficient documentation

## 2021-04-19 DIAGNOSIS — E749 Disorder of carbohydrate metabolism, unspecified: Secondary | ICD-10-CM | POA: Insufficient documentation

## 2021-04-19 DIAGNOSIS — I10 Essential (primary) hypertension: Secondary | ICD-10-CM | POA: Insufficient documentation

## 2021-04-19 DIAGNOSIS — E785 Hyperlipidemia, unspecified: Secondary | ICD-10-CM | POA: Insufficient documentation

## 2021-04-19 DIAGNOSIS — H9190 Unspecified hearing loss, unspecified ear: Secondary | ICD-10-CM | POA: Insufficient documentation

## 2021-04-19 DIAGNOSIS — M25529 Pain in unspecified elbow: Secondary | ICD-10-CM | POA: Insufficient documentation

## 2021-04-20 ENCOUNTER — Other Ambulatory Visit

## 2021-04-20 ENCOUNTER — Other Ambulatory Visit: Payer: Self-pay

## 2021-04-21 ENCOUNTER — Other Ambulatory Visit: Payer: Self-pay

## 2021-04-21 ENCOUNTER — Telehealth: Payer: Self-pay

## 2021-04-21 DIAGNOSIS — R7309 Other abnormal glucose: Secondary | ICD-10-CM

## 2021-04-21 DIAGNOSIS — E7800 Pure hypercholesterolemia, unspecified: Secondary | ICD-10-CM

## 2021-04-21 LAB — LIPID PANEL
Cholesterol: 212 mg/dL — ABNORMAL HIGH (ref <200)
HDL: 46 mg/dL (ref >=40)
LDL Cholesterol (Calc): 136 mg/dL (calc) — ABNORMAL HIGH
Non-HDL Cholesterol (Calc): 166 mg/dL (calc) — ABNORMAL HIGH (ref <130)
Total CHOL/HDL Ratio: 4.6 (calc) (ref <5.0)
Triglycerides: 161 mg/dL — ABNORMAL HIGH (ref <150)

## 2021-04-21 LAB — CBC WITH DIFFERENTIAL/PLATELET
Absolute Monocytes: 442 cells/uL (ref 200–950)
Basophils Absolute: 58 cells/uL (ref 0–200)
Basophils Relative: 0.9 %
Eosinophils Absolute: 262 cells/uL (ref 15–500)
Eosinophils Relative: 4.1 %
HCT: 46.3 % (ref 38.5–50.0)
Hemoglobin: 15.8 g/dL (ref 13.2–17.1)
Lymphs Abs: 2483 cells/uL (ref 850–3900)
MCH: 32 pg (ref 27.0–33.0)
MCHC: 34.1 g/dL (ref 32.0–36.0)
MCV: 93.7 fL (ref 80.0–100.0)
MPV: 10.4 fL (ref 7.5–12.5)
Monocytes Relative: 6.9 %
Neutro Abs: 3155 cells/uL (ref 1500–7800)
Neutrophils Relative %: 49.3 %
Platelets: 264 10*3/uL (ref 140–400)
RBC: 4.94 10*6/uL (ref 4.20–5.80)
RDW: 12.1 % (ref 11.0–15.0)
Total Lymphocyte: 38.8 %
WBC: 6.4 10*3/uL (ref 3.8–10.8)

## 2021-04-21 LAB — COMPLETE METABOLIC PANEL WITH GFR
AG Ratio: 1.9 (calc) (ref 1.0–2.5)
ALT: 44 U/L (ref 9–46)
AST: 27 U/L (ref 10–35)
Albumin: 4.6 g/dL (ref 3.6–5.1)
Alkaline phosphatase (APISO): 62 U/L (ref 35–144)
BUN: 15 mg/dL (ref 7–25)
CO2: 27 mmol/L (ref 20–32)
Calcium: 9.4 mg/dL (ref 8.6–10.3)
Chloride: 103 mmol/L (ref 98–110)
Creat: 1.21 mg/dL (ref 0.70–1.30)
Globulin: 2.4 g/dL (calc) (ref 1.9–3.7)
Glucose, Bld: 113 mg/dL — ABNORMAL HIGH (ref 65–99)
Potassium: 4.6 mmol/L (ref 3.5–5.3)
Sodium: 140 mmol/L (ref 135–146)
Total Bilirubin: 0.6 mg/dL (ref 0.2–1.2)
Total Protein: 7 g/dL (ref 6.1–8.1)
eGFR: 69 mL/min/{1.73_m2} (ref >=60)

## 2021-04-21 LAB — HEMOGLOBIN A1C
Hgb A1c MFr Bld: 5.9 % of total Hgb — ABNORMAL HIGH (ref <5.7)
Mean Plasma Glucose: 123 mg/dL
eAG (mmol/L): 6.8 mmol/L

## 2021-04-21 LAB — PSA: PSA: 1.91 ng/mL (ref <=4.00)

## 2021-04-21 MED ORDER — SEMAGLUTIDE(0.25 OR 0.5MG/DOS) 2 MG/1.5ML ~~LOC~~ SOPN: 0.5000 mg | PEN_INJECTOR | SUBCUTANEOUS | 0 refills | Status: DC

## 2021-04-21 MED ORDER — ROSUVASTATIN CALCIUM 10 MG PO TABS: 10.0000 mg | ORAL_TABLET | Freq: Every day | ORAL | 3 refills | Status: DC

## 2021-04-21 NOTE — Telephone Encounter (Signed)
PA for Ozempic started via CoverMyMeds ? ?Willie Coleman (Key: [id number]) ?Rx #: [id number] ?Ozempic (0.25 or 0.5 MG/DOSE) '2MG'$ /1.5ML pen-injectors ? ?Dx:  ?Elevated hemoglobin A1c  - Primary R73.09  ? ?

## 2021-04-24 NOTE — Telephone Encounter (Signed)
Willie Coleman (Key: [id number]) ?Rx #: [id number] ?Ozempic (0.25 or 0.5 MG/DOSE) '2MG'$ /1.5ML pen-injectors ?CaseId:[id number];Status:Denied ? ? ?PA has been DENIED ? ?Pt is aware.  ?

## 2021-05-01 ENCOUNTER — Ambulatory Visit: Admitting: Urology

## 2021-05-01 ENCOUNTER — Encounter: Payer: Self-pay | Admitting: Urology

## 2021-05-01 VITALS — BP 154/102 | HR 89 | Ht 71.0 in | Wt 303.0 lb

## 2021-05-01 DIAGNOSIS — N433 Hydrocele, unspecified: Secondary | ICD-10-CM

## 2021-05-01 DIAGNOSIS — N5089 Other specified disorders of the male genital organs: Secondary | ICD-10-CM

## 2021-05-01 NOTE — Progress Notes (Signed)
? ?Assessment: ?1. Scrotal swelling   ?2. Hydrocele, left   ? ? ?Plan: ?Recommend further evaluation with a scrotal ultrasound with doppler ?I will contact him with results. ?Diagnosis and management of a hydrocele discussed with the patient.  Surgical management with a hydrocelectomy reviewed including potential risks.  I discussed the risk of infection, bleeding, injury to scrotal structures, possible recurrence of the hydrocele (25%), need for additional procedures, and anesthetic risk.  He would like to consider his options and we will discuss further after the ultrasound results are available. ? ?Chief Complaint:  ?Chief Complaint  ?Patient presents with  ? Hydrocele  ? ? ?History of Present Illness: ? ?Willie Coleman is a 61 y.o. year old male who is seen in consultation from Susy Frizzle, MD for evaluation of left scrotal swelling.  This has been gradually increasing in size for the past 2-3 years.  No discomfort associated with the swelling.  No known history of scrotal trauma or infection.  No imaging studies have been performed.  He is status post a left hernia repair in the 1980s. ?He reports urinary frequency but no other lower urinary tract symptoms.  No dysuria or gross hematuria. ?IPSS = 3 today. ? ? ?Past Medical History:  ?Past Medical History:  ?Diagnosis Date  ? Hyperlipidemia   ? Hypertension   ? Sleep apnea   ? ? ?Past Surgical History:  ?Past Surgical History:  ?Procedure Laterality Date  ? COLONOSCOPY N/A 08/29/2016  ? Procedure: COLONOSCOPY;  Surgeon: Rogene Houston, MD;  Location: [facility];  Service: Endoscopy;  Laterality: N/A;  830  ? HERNIA REPAIR    ? inguinal  ? TONSILLECTOMY    ? ? ?Allergies:  ?No Known Allergies ? ?Family History:  ?Family History  ?Adopted: Yes  ? ? ?Social History:  ?Social History  ? ?Tobacco Use  ? Smoking status: Former  ?  Years: 30.00  ?  Types: Cigarettes  ?  Quit date: 09/19/2012  ?  Years since quitting: 8.6  ? Smokeless tobacco: Never  ?Vaping  Use  ? Vaping Use: Never used  ?Substance Use Topics  ? Alcohol use: Yes  ?  Comment: 2 drinks per week  ? Drug use: No  ? ? ?Review of symptoms:  ?Constitutional:  Negative for unexplained weight loss, night sweats, fever, chills ?ENT:  Negative for nose bleeds, sinus pain, painful swallowing ?CV:  Negative for chest pain, shortness of breath, exercise intolerance, palpitations, loss of consciousness ?Resp:  Negative for cough, wheezing, shortness of breath ?GI:  Negative for nausea, vomiting, diarrhea, bloody stools ?GU:  Positives noted in HPI; otherwise negative for gross hematuria, dysuria, urinary incontinence ?Neuro:  Negative for seizures, poor balance, limb weakness, slurred speech ?Psych:  Negative for lack of energy, depression, anxiety ?Endocrine:  Negative for polydipsia, polyuria, symptoms of hypoglycemia (dizziness, hunger, sweating) ?Hematologic:  Negative for anemia, purpura, petechia, prolonged or excessive bleeding, use of anticoagulants  ?Allergic:  Negative for difficulty breathing or choking as a result of exposure to anything; no shellfish allergy; no allergic response (rash/itch) to materials, foods ? ?Physical exam: ?BP (!) 154/102   Pulse 89   Ht '5\' 11"'$  (1.803 m)   Wt (!) 303 lb (137.4 kg)   BMI 42.26 kg/m?  ?GENERAL APPEARANCE:  Well appearing, well developed, well nourished, NAD ?HEENT: Atraumatic, Normocephalic, oropharynx clear. ?NECK: Supple without lymphadenopathy or thyromegaly. ?LUNGS: Clear to auscultation bilaterally. ?HEART: Regular Rate and Rhythm without murmurs, gallops, or rubs. ?ABDOMEN: Soft,  non-tender, No Masses. ?EXTREMITIES: Moves all extremities well.  Without clubbing, cyanosis, or edema. ?NEUROLOGIC:  Alert and oriented x 3, normal gait, CN II-XII grossly intact.  ?MENTAL STATUS:  Appropriate. ?BACK:  Non-tender to palpation.  No CVAT ?SKIN:  Warm, dry and intact.   ?GU: ?Penis:  uncircumcised ?Meatus: Normal ?Scrotum: Enlargement of the left scrotum without  erythema; nontender ?Testis: Right, normal; unable to palpate left secondary to scrotal swelling ? ? ?Results: ?None ? ?

## 2021-05-08 ENCOUNTER — Ambulatory Visit (HOSPITAL_COMMUNITY)
Admission: RE | Admit: 2021-05-08 | Discharge: 2021-05-08 | Disposition: A | Discharge status: Home / Self Care | Source: Ambulatory Visit | Attending: Urology | Admitting: Urology

## 2021-05-08 DIAGNOSIS — N5089 Other specified disorders of the male genital organs: Secondary | ICD-10-CM | POA: Insufficient documentation

## 2021-05-09 ENCOUNTER — Ambulatory Visit
Admission: RE | Admit: 2021-05-09 | Discharge: 2021-05-09 | Disposition: A | Discharge status: Home / Self Care | Source: Ambulatory Visit | Attending: Family Medicine | Admitting: Family Medicine

## 2021-05-09 DIAGNOSIS — Z87891 Personal history of nicotine dependence: Secondary | ICD-10-CM

## 2021-05-09 DIAGNOSIS — Z122 Encounter for screening for malignant neoplasm of respiratory organs: Secondary | ICD-10-CM

## 2021-05-16 ENCOUNTER — Encounter: Payer: Self-pay | Admitting: Urology

## 2021-05-25 NOTE — Telephone Encounter (Signed)
From [employer]/admin [facility] [place] [facility] ? ?PA for Ozempic Pen Inj. As been denied ? ? ?

## 2021-06-08 ENCOUNTER — Encounter: Payer: Self-pay | Admitting: Neurology

## 2021-06-08 ENCOUNTER — Ambulatory Visit (INDEPENDENT_AMBULATORY_CARE_PROVIDER_SITE_OTHER): Admitting: Neurology

## 2021-06-08 VITALS — BP 130/86 | HR 76 | Ht 71.0 in | Wt 306.2 lb

## 2021-06-08 DIAGNOSIS — Z9189 Other specified personal risk factors, not elsewhere classified: Secondary | ICD-10-CM | POA: Diagnosis not present

## 2021-06-08 DIAGNOSIS — G4719 Other hypersomnia: Secondary | ICD-10-CM

## 2021-06-08 DIAGNOSIS — R0689 Other abnormalities of breathing: Secondary | ICD-10-CM

## 2021-06-08 DIAGNOSIS — R0683 Snoring: Secondary | ICD-10-CM

## 2021-06-08 DIAGNOSIS — R519 Headache, unspecified: Secondary | ICD-10-CM

## 2021-06-08 DIAGNOSIS — R600 Localized edema: Secondary | ICD-10-CM

## 2021-06-08 NOTE — Patient Instructions (Signed)

## 2021-06-08 NOTE — Progress Notes (Signed)
Subjective:  ?  ?Patient ID: Willie Coleman is a 61 y.o. male. ? ?HPI ? ? ? ?Star Age, MD, PhD ?[facility] ?[address] ?[phone] ?[place], [place] [address] ? ?Dear Dr. Dennard Coleman,  ? ?I saw your patient, Willie Coleman, upon your kind request, in my Sleep clinic today for initial consultation of his sleep disorder, in particular, concern for underlying obstructive sleep apnea.  The patient is unaccompanied today.  As you know, Willie Coleman is a 61 year old right-handed gentleman with an underlying medical history of prediabetes, hyperlipidemia, elevated blood pressure values, and severe obesity with a BMI of over 40, who reports snoring and excessive daytime somnolence, as well as nonrestorative sleep.  He has woken up with a sense of gasping for air.  He occasionally wakes up with a dull headache.  He has no night to night nocturia.  I reviewed your office note from 04/18/2021.  His Epworth sleepiness score is 6 out of 24, fatigue severity score is 45 out of 63.  He lives alone, he has grown children, ages 87 and 68.  He has no pets in the household, he does have a TV in the bedroom but turns it off before falling asleep.  Bedtime is generally around 10 PM and rise time during the workweek around 5 AM.  On the weekend he may sleep till 6:30 AM.  He drinks caffeine in the form of coffee, about 2 cups/day, occasional alcohol, quit smoking over 15 years ago.  He works in Land at [employer].  He does not wake up rested.  He has gained weight in the past couple of years.  He is working on weight loss now. ? ?His Past Medical History Is Significant For: ?Past Medical History:  ?Diagnosis Date  ? Hyperlipidemia   ? Hypertension   ? Sleep apnea   ? ? ?His Past Surgical History Is Significant For: ?Past Surgical History:  ?Procedure Laterality Date  ? COLONOSCOPY N/A 08/29/2016  ? Procedure: COLONOSCOPY;  Surgeon: Rogene Houston, MD;  [facility];  Service: Endoscopy;   Laterality: N/A;  830  ? HERNIA REPAIR    ? inguinal  ? TONSILLECTOMY    ? ? ?His Family History Is Significant For: ?Family History  ?Adopted: Yes  ?Problem Relation Age of Onset  ? Sleep apnea Neg Hx   ? ? ?His Social History Is Significant For: ?Social History  ? ?Socioeconomic History  ? Marital status: Divorced  ?  Spouse name: Not on file  ? Number of children: Not on file  ? Years of education: Not on file  ? Highest education level: Not on file  ?Occupational History  ? Not on file  ?Tobacco Use  ? Smoking status: Former  ?  Years: 30.00  ?  Types: Cigarettes  ?  Quit date: 09/19/2012  ?  Years since quitting: 8.7  ? Smokeless tobacco: Never  ?Vaping Use  ? Vaping Use: Never used  ?Substance and Sexual Activity  ? Alcohol use: Yes  ?  Comment: 2 drinks per week  ? Drug use: No  ? Sexual activity: Not on file  ?  Comment: adopted  ?Other Topics Concern  ? Not on file  ?Social History Narrative  ? Not on file  ? ?Social Determinants of Health  ? ?Financial Resource Strain: Not on file  ?Food Insecurity: Not on file  ?Transportation Needs: Not on file  ?Physical Activity: Not on file  ?Stress: Not on  file  ?Social Connections: Not on file  ? ? ?His Allergies Are:  ?No Known Allergies:  ? ?His Current Medications Are:  ?Outpatient Encounter Medications as of 06/08/2021  ?Medication Sig  ? amLODipine (NORVASC) 10 MG tablet TAKE 1 TABLET BY MOUTH DAILY  ? Multiple Vitamin (MULTIVITAMIN WITH MINERALS) TABS tablet Take 1 tablet by mouth daily.  ? rosuvastatin (CRESTOR) 10 MG tablet Take 1 tablet (10 mg total) by mouth daily.  ? ?No facility-administered encounter medications on file as of 06/08/2021.  ?: ? ? ?Review of Systems:  ?Out of a complete 14 point review of systems, all are reviewed and negative with the exception of these symptoms as listed below: ? ?Review of Systems  ?Neurological:   ?      Pt is here for sleep consult   pt states snores,headaches,fatigue,hypertension  ?Pt denies sleep study, CPAP machine   ? ? ?ESS:6  ?FSS:45  ? ?Objective:  ?Neurological Exam ? ?Physical Exam ?Physical Examination:  ? ?Vitals:  ? 06/08/21 1524  ?BP: 130/86  ?Pulse: 76  ? ? ?General Examination: The patient is a very pleasant 61 y.o. male in no acute distress. He appears well-developed and well-nourished and well groomed.  ? ?HEENT: Normocephalic, atraumatic, pupils are equal, round and reactive to light, extraocular tracking is good without limitation to gaze excursion or nystagmus noted.  Corrective eyeglasses in place.  Hearing is grossly intact. Face is symmetric with normal facial animation. Speech is clear with no dysarthria noted. There is no hypophonia. There is no lip, neck/head, jaw or voice tremor. Neck is supple with full range of passive and active motion. There are no carotid bruits on auscultation. Oropharynx exam reveals: mild mouth dryness, adequate dental hygiene and moderate airway crowding, due to redundant soft palate and wider uvula, tonsils absent.  Mallampati class III.  Neck circumference of 18-1/2 inches, minimal to absent overbite.  Tongue protrudes centrally and palate elevates symmetrically. ? ?Chest: Clear to auscultation without wheezing, rhonchi or crackles noted. ? ?Heart: S1+S2+0, regular and normal without murmurs, rubs or gallops noted.  ? ?Abdomen: Soft, non-tender and non-distended. ? ?Extremities: There is 1+ pitting edema in the distal lower extremities bilaterally.  ? ?Skin: Warm and dry without trophic changes noted.  ? ?Musculoskeletal: exam reveals no obvious joint deformities.  ? ?Neurologically:  ?Mental status: The patient is awake, alert and oriented in all 4 spheres. His immediate and remote memory, attention, language skills and fund of knowledge are appropriate. There is no evidence of aphasia, agnosia, apraxia or anomia. Speech is clear with normal prosody and enunciation. Thought process is linear. Mood is normal and affect is normal.  ?Cranial nerves II - XII are as described  above under HEENT exam.  ?Motor exam: Normal bulk, strength and tone is noted. There is no tremor, Romberg is negative. Reflexes are 2+ throughout. Fine motor skills and coordination: grossly intact.  ?Cerebellar testing: No dysmetria or intention tremor. There is no truncal or gait ataxia.  ?Sensory exam: intact to light touch in the upper and lower extremities.  ?Gait, station and balance: He stands easily. No veering to one side is noted. No leaning to one side is noted. Posture is age-appropriate and stance is narrow based. Gait shows normal stride length and normal pace. No problems turning are noted.  ? ?Assessment and Plan:  ?In summary, Willie Coleman is a very pleasant 61 y.o.-year old male with an underlying medical history of prediabetes, hyperlipidemia, elevated blood pressure values, and severe  obesity with a BMI of over 40, whose history and physical exam are concerning for obstructive sleep apnea (OSA). ?I had a long chat with the patient about my findings and the diagnosis of OSA, its prognosis and treatment options. We talked about medical treatments, surgical interventions and non-pharmacological approaches. I explained in particular the risks and ramifications of untreated moderate to severe OSA, especially with respect to developing cardiovascular disease down the Road, including congestive heart failure, difficult to treat hypertension, cardiac arrhythmias, or stroke. Even type 2 diabetes has, in part, been linked to untreated OSA. Symptoms of untreated OSA include daytime sleepiness, memory problems, mood irritability and mood disorder such as depression and anxiety, lack of energy, as well as recurrent headaches, especially morning headaches. We talked about trying to maintain a healthy lifestyle in general, as well as the importance of weight control. We also talked about the importance of good sleep hygiene. ?I recommended the following at this time: sleep study.  I outlined the differences  between a laboratory attended sleep study versus home sleep testing.  He would like to pursue a home sleep test, not a laboratory test. ?I explained the sleep test procedure to the patient and also outlined possibl

## 2021-06-09 NOTE — Patient Instructions (Signed)
? ? ? ? ? ? ? ? Willie Coleman ? 06/09/2021  ?  ? '@PREFPERIOPPHARMACY'$ @ ? ? Your procedure is scheduled on  06/14/2021. ? ? Report to [facility] at  0600  A.M. ? ? Call this number if you have problems the morning of surgery: ? [phone] ? ? Remember: ? Do not eat or drink after midnight. ?  ?  ? Take these medicines the morning of surgery with A SIP OF WATER  ? ?                                   Amlodipine. ?  ? ? Do not wear jewelry, make-up or nail polish. ? Do not wear lotions, powders, or perfumes, or deodorant. ? Do not shave 48 hours prior to surgery.  Men may shave face and neck. ? Do not bring valuables to the hospital. ? [facility] is not responsible for any belongings or valuables. ? ?Contacts, dentures or bridgework may not be worn into surgery.  Leave your suitcase in the car.  After surgery it may be brought to your room. ? ?For patients admitted to the hospital, discharge time will be determined by your treatment team. ? ?Patients discharged the day of surgery will not be allowed to drive home and must have someone with them for 24 hours.  ? ? ?Special instructions:   DO NOT smoke tobacco or vape for 24 hours before your procedure. ? ?Please read over the following fact sheets that you were given. ?Coughing and Deep Breathing, Surgical Site Infection Prevention, Anesthesia Post-op Instructions, and Care and Recovery After Surgery ?  ? ? ? Hydrocelectomy, Adult, Care After ?The following information offers guidance on how to care for yourself after your procedure. Your health care provider may also give you more specific instructions. If you have problems or questions, contact your health care provider. ?What can I expect after the procedure? ?After your procedure, it is common to have: ?Mild discomfort and swelling in the pouch that holds your testicles (scrotum). ?Bruising of the scrotum. ?Follow these instructions at home: ?Medicines ?Take over-the-counter and prescription medicines only as told  by your health care provider. ?Ask your health care provider if the medicine prescribed to you: ?Requires you to avoid driving or using machinery. ?Can cause constipation. You may need to take these actions to prevent or treat constipation: ?Drink enough fluid to keep your urine pale yellow. ?Take over-the-counter or prescription medicines. ?Eat foods that are high in fiber, such as beans, whole grains, and fresh fruits and vegetables. ?Limit foods that are high in fat and processed sugars, such as fried or sweet foods. ?Bathing ?Do not take baths, swim, or use a hot tub until your health care provider approves. Ask your health care provider if you may take showers. You may only be allowed to take sponge baths. ?If you were told to wear an athletic support strap (scrotal support), keep it dry. Take it off when you shower or bathe. ?Incision care ? ?Follow instructions from your health care provider about how to take care of your incision. Make sure you: ?Wash your hands with soap and water for at least 20 seconds before and after you change your bandage (dressing). If soap and water are not available, use hand sanitizer. ?Change your dressing as told by your health care provider. ?Leave stitches (sutures), skin glue, or adhesive strips in place. These skin closures  may need to stay in place for 2 weeks or longer. If adhesive strip edges start to loosen and curl up, you may trim the loose edges. Do not remove adhesive strips completely unless your health care provider tells you to do that. ?Check your incision and scrotum every day for signs of infection. Check for: ?More redness, swelling, or pain. ?Fluid or blood. ?Warmth. ?Pus or a bad smell. ?Managing pain and swelling ?If directed, put ice on the affected area. To do this: ?Put ice in a plastic bag. ?Place a towel between your skin and the bag. ?Leave the ice on for 20 minutes, 2-3 times per day. ?Remove the ice if your skin turns bright red. This is very  important. If you cannot feel pain, heat, or cold, you have a greater risk of damage to the area. ? ?Activity ?Do not lift anything that is heavier than 10 lb (4.5 kg) until your health care provider says that it is safe. ?If you were given a sedative during the procedure, it can affect you for several hours. Do not drive or operate machinery until your health care provider says that it is safe. ?Ask your health care provider when it is safe to drive. ?Return to your normal activities as told by your health care provider. Ask your health care provider what activities are safe for you. ?General instructions ?Do not use any products that contain nicotine or tobacco. These products include cigarettes, chewing tobacco, and vaping devices, such as e-cigarettes. These can delay healing after surgery. If you need help quitting, ask your health care provider. ?If you were given a scrotal support, wear it as told by your health care provider. ?Keep all follow-up visits. This is important. If you had a drain put in during the procedure, you will need to have it removed at a follow-up visit. ?Contact a health care provider if: ?Your pain is not controlled with medicine. ?You have more redness, swelling, or pain around your scrotum. ?You have fluid or blood coming from your incision. ?Your incision feels warm to the touch. ?You have pus or a bad smell coming from your incision. ?You have a fever. ?Get help right away if: ?You develop shaking, chills, and a fever that is higher than 101.8?F (38.8?C). ?You have redness or swelling that starts at your scrotum and spreads outward to your whole groin. ?You develop swelling in your legs. ?You have difficulty breathing. ?These symptoms may be an emergency. Get help right away. Call 911. ?Do not wait to see if the symptoms will go away. ?Do not drive yourself to the hospital. ?Summary ?After a hydrocelectomy, it is common to have mild discomfort, swelling, and bruising. ?Do not take  baths, swim, or use a hot tub until your health care provider approves. Ask your health care provider if you may take showers. ?If directed, put ice on the affected area to help with pain and swelling. ?Do not lift anything that is heavier than 10 lb (4.5 kg) until your health care provider says that it is safe. Return to your normal activities as told by your health care provider. ?If you were given a scrotal support, keep it dry. Wear the scrotal support as told by your health care provider. ?This information is not intended to replace advice given to you by your health care provider. Make sure you discuss any questions you have with your health care provider. ?Document Revised: 09/01/2020 Document Reviewed: 09/01/2020 ?Elsevier Patient Education ? [place]. ?General Anesthesia,  Adult, Care After ?This sheet gives you information about how to care for yourself after your procedure. Your health care provider may also give you more specific instructions. If you have problems or questions, contact your health care provider. ?What can I expect after the procedure? ?After the procedure, the following side effects are common: ?Pain or discomfort at the IV site. ?Nausea. ?Vomiting. ?Sore throat. ?Trouble concentrating. ?Feeling cold or chills. ?Feeling weak or tired. ?Sleepiness and fatigue. ?Soreness and body aches. These side effects can affect parts of the body that were not involved in surgery. ?Follow these instructions at home: ?For the time period you were told by your health care provider: ? ?Rest. ?Do not participate in activities where you could fall or become injured. ?Do not drive or use machinery. ?Do not drink alcohol. ?Do not take sleeping pills or medicines that cause drowsiness. ?Do not make important decisions or sign legal documents. ?Do not take care of children on your own. ?Eating and drinking ?Follow any instructions from your health care provider about eating or drinking  restrictions. ?When you feel hungry, start by eating small amounts of foods that are soft and easy to digest (bland), such as toast. Gradually return to your regular diet. ?Drink enough fluid to keep your urine pale yellow. ?I

## 2021-06-12 ENCOUNTER — Encounter (HOSPITAL_COMMUNITY): Payer: Self-pay

## 2021-06-12 ENCOUNTER — Encounter (HOSPITAL_COMMUNITY)
Admission: RE | Admit: 2021-06-12 | Discharge: 2021-06-12 | Disposition: A | Discharge status: Home / Self Care | Source: Ambulatory Visit | Attending: Urology | Admitting: Urology

## 2021-06-12 VITALS — BP 130/86 | HR 86 | Temp 97.8°F | Resp 18 | Ht 71.0 in | Wt 306.2 lb

## 2021-06-12 DIAGNOSIS — I10 Essential (primary) hypertension: Secondary | ICD-10-CM | POA: Diagnosis not present

## 2021-06-12 DIAGNOSIS — Z01810 Encounter for preprocedural cardiovascular examination: Secondary | ICD-10-CM | POA: Diagnosis not present

## 2021-06-12 NOTE — Progress Notes (Signed)
?   06/12/21 1527  ?OBSTRUCTIVE SLEEP APNEA  ?Have you ever been diagnosed with sleep apnea through a sleep study? No  ?Do you snore loudly (loud enough to be heard through closed doors)?  1  ?Do you often feel tired, fatigued, or sleepy during the daytime (such as falling asleep during driving or talking to someone)? 0  ?Has anyone observed you stop breathing during your sleep? 0  ?Do you have, or are you being treated for high blood pressure? 1  ?BMI more than 35 kg/m2? 1  ?Age > 46 (1-yes) 1  ?Neck circumference greater than:Male 16 inches or larger, Male 17inches or larger? 0  ?Male Gender (Yes=1) 1  ?Obstructive Sleep Apnea Score 5  ?Score 5 or greater  Results sent to PCP  ? ? ?

## 2021-06-13 NOTE — Telephone Encounter (Signed)
Please advise 

## 2021-06-14 ENCOUNTER — Encounter (HOSPITAL_COMMUNITY)
Admission: RE | Disposition: A | Discharge status: Home / Self Care | Payer: Self-pay | Source: Home / Self Care | Attending: Urology

## 2021-06-14 ENCOUNTER — Ambulatory Visit (HOSPITAL_COMMUNITY)
Admission: RE | Admit: 2021-06-14 | Discharge: 2021-06-14 | Disposition: A | Discharge status: Home / Self Care | Attending: Urology | Admitting: Urology

## 2021-06-14 ENCOUNTER — Other Ambulatory Visit: Payer: Self-pay

## 2021-06-14 ENCOUNTER — Ambulatory Visit (HOSPITAL_BASED_OUTPATIENT_CLINIC_OR_DEPARTMENT_OTHER): Admitting: Anesthesiology

## 2021-06-14 ENCOUNTER — Ambulatory Visit (HOSPITAL_COMMUNITY): Admitting: Anesthesiology

## 2021-06-14 ENCOUNTER — Encounter (HOSPITAL_COMMUNITY): Payer: Self-pay | Admitting: Urology

## 2021-06-14 DIAGNOSIS — G4730 Sleep apnea, unspecified: Secondary | ICD-10-CM | POA: Insufficient documentation

## 2021-06-14 DIAGNOSIS — Z6841 Body Mass Index (BMI) 40.0 and over, adult: Secondary | ICD-10-CM

## 2021-06-14 DIAGNOSIS — E663 Overweight: Secondary | ICD-10-CM

## 2021-06-14 DIAGNOSIS — K219 Gastro-esophageal reflux disease without esophagitis: Secondary | ICD-10-CM | POA: Insufficient documentation

## 2021-06-14 DIAGNOSIS — H9190 Unspecified hearing loss, unspecified ear: Secondary | ICD-10-CM

## 2021-06-14 DIAGNOSIS — I10 Essential (primary) hypertension: Secondary | ICD-10-CM

## 2021-06-14 DIAGNOSIS — R350 Frequency of micturition: Secondary | ICD-10-CM | POA: Insufficient documentation

## 2021-06-14 DIAGNOSIS — E749 Disorder of carbohydrate metabolism, unspecified: Secondary | ICD-10-CM

## 2021-06-14 DIAGNOSIS — N433 Hydrocele, unspecified: Secondary | ICD-10-CM

## 2021-06-14 DIAGNOSIS — M25529 Pain in unspecified elbow: Secondary | ICD-10-CM

## 2021-06-14 DIAGNOSIS — E785 Hyperlipidemia, unspecified: Secondary | ICD-10-CM

## 2021-06-14 DIAGNOSIS — R030 Elevated blood-pressure reading, without diagnosis of hypertension: Secondary | ICD-10-CM

## 2021-06-14 DIAGNOSIS — Z87891 Personal history of nicotine dependence: Secondary | ICD-10-CM | POA: Diagnosis not present

## 2021-06-14 DIAGNOSIS — Z1211 Encounter for screening for malignant neoplasm of colon: Secondary | ICD-10-CM

## 2021-06-14 DIAGNOSIS — F4321 Adjustment disorder with depressed mood: Secondary | ICD-10-CM

## 2021-06-14 DIAGNOSIS — N5089 Other specified disorders of the male genital organs: Secondary | ICD-10-CM

## 2021-06-14 HISTORY — PX: HYDROCELE EXCISION: SHX482

## 2021-06-14 SURGERY — HYDROCELECTOMY
Anesthesia: General | Site: Scrotum | Laterality: Left

## 2021-06-14 MED ORDER — ONDANSETRON HCL 4 MG/2ML IJ SOLN: 4.0000 mg | Freq: Once | INTRAMUSCULAR | Status: DC | PRN

## 2021-06-14 MED ORDER — ORAL CARE MOUTH RINSE: 15.0000 mL | Freq: Once | OROMUCOSAL | Status: AC

## 2021-06-14 MED ORDER — PROPOFOL 10 MG/ML IV BOLUS
INTRAVENOUS | Status: DC | PRN
  Administered 2021-06-14: 280 mg via INTRAVENOUS

## 2021-06-14 MED ORDER — BUPIVACAINE HCL (PF) 0.25 % IJ SOLN
INTRAMUSCULAR | Status: AC
  Filled 2021-06-14: qty 30

## 2021-06-14 MED ORDER — MIDAZOLAM HCL 2 MG/2ML IJ SOLN
INTRAMUSCULAR | Status: AC
  Filled 2021-06-14: qty 2

## 2021-06-14 MED ORDER — HYDROMORPHONE HCL 1 MG/ML IJ SOLN: 0.2500 mg | INTRAMUSCULAR | Status: DC | PRN

## 2021-06-14 MED ORDER — ONDANSETRON HCL 4 MG/2ML IJ SOLN
INTRAMUSCULAR | Status: AC
Start: 2021-06-14 — End: ?
  Filled 2021-06-14: qty 2

## 2021-06-14 MED ORDER — PROPOFOL 10 MG/ML IV BOLUS
INTRAVENOUS | Status: AC
  Filled 2021-06-14: qty 20

## 2021-06-14 MED ORDER — LACTATED RINGERS IV SOLN: INTRAVENOUS | Status: DC

## 2021-06-14 MED ORDER — 0.9 % SODIUM CHLORIDE (POUR BTL) OPTIME
TOPICAL | Status: DC | PRN
Start: 2021-06-14 — End: 2021-06-14
  Administered 2021-06-14: 1000 mL

## 2021-06-14 MED ORDER — FENTANYL CITRATE (PF) 250 MCG/5ML IJ SOLN
INTRAMUSCULAR | Status: DC | PRN
  Administered 2021-06-14 (×4): 25 ug via INTRAVENOUS

## 2021-06-14 MED ORDER — MIDAZOLAM HCL 2 MG/2ML IJ SOLN
INTRAMUSCULAR | Status: DC | PRN
  Administered 2021-06-14: 2 mg via INTRAVENOUS

## 2021-06-14 MED ORDER — CEFAZOLIN IN SODIUM CHLORIDE 3-0.9 GM/100ML-% IV SOLN
3.0000 g | INTRAVENOUS | Status: AC
  Administered 2021-06-14: 3 g via INTRAVENOUS
  Filled 2021-06-14: qty 100

## 2021-06-14 MED ORDER — BUPIVACAINE HCL (PF) 0.25 % IJ SOLN
INTRAMUSCULAR | Status: DC | PRN
Start: 2021-06-14 — End: 2021-06-14
  Administered 2021-06-14: 6 mL

## 2021-06-14 MED ORDER — CHLORHEXIDINE GLUCONATE 0.12 % MT SOLN
15.0000 mL | Freq: Once | OROMUCOSAL | Status: AC
  Administered 2021-06-14: 15 mL via OROMUCOSAL

## 2021-06-14 MED ORDER — KETOROLAC TROMETHAMINE 30 MG/ML IJ SOLN
INTRAMUSCULAR | Status: DC | PRN
  Administered 2021-06-14: 30 mg via INTRAVENOUS

## 2021-06-14 MED ORDER — LIDOCAINE HCL (PF) 2 % IJ SOLN
INTRAMUSCULAR | Status: AC
  Filled 2021-06-14: qty 5

## 2021-06-14 MED ORDER — MEPERIDINE HCL 50 MG/ML IJ SOLN: 6.2500 mg | INTRAMUSCULAR | Status: DC | PRN

## 2021-06-14 MED ORDER — HYDROCODONE-ACETAMINOPHEN 5-325 MG PO TABS
1.0000 | ORAL_TABLET | Freq: Four times a day (QID) | ORAL | 0 refills | Status: DC | PRN
Start: 2021-06-14 — End: 2021-10-26

## 2021-06-14 MED ORDER — LIDOCAINE HCL (CARDIAC) PF 100 MG/5ML IV SOSY
PREFILLED_SYRINGE | INTRAVENOUS | Status: DC | PRN
Start: 2021-06-14 — End: 2021-06-14
  Administered 2021-06-14: 60 mg via INTRAVENOUS

## 2021-06-14 MED ORDER — FENTANYL CITRATE (PF) 100 MCG/2ML IJ SOLN
INTRAMUSCULAR | Status: AC
  Filled 2021-06-14: qty 2

## 2021-06-14 MED ORDER — ONDANSETRON HCL 4 MG/2ML IJ SOLN
INTRAMUSCULAR | Status: DC | PRN
  Administered 2021-06-14: 4 mg via INTRAVENOUS

## 2021-06-14 MED ORDER — CEPHALEXIN 500 MG PO CAPS: 500.0000 mg | ORAL_CAPSULE | Freq: Three times a day (TID) | ORAL | 0 refills | Status: AC

## 2021-06-14 SURGICAL SUPPLY — 28 items
ADH SKN CLS APL DERMABOND .7 (GAUZE/BANDAGES/DRESSINGS) ×1
COVER LIGHT HANDLE STERIS (MISCELLANEOUS) ×4 IMPLANT
DECANTER SPIKE VIAL GLASS SM (MISCELLANEOUS) ×2 IMPLANT
DERMABOND ADVANCED (GAUZE/BANDAGES/DRESSINGS) ×1
DERMABOND ADVANCED .7 DNX12 (GAUZE/BANDAGES/DRESSINGS) ×1 IMPLANT
DRAIN PENROSE 1X12 (DRAIN) ×2 IMPLANT
ELECT REM PT RETURN 9FT ADLT (ELECTROSURGICAL) ×2
ELECTRODE REM PT RTRN 9FT ADLT (ELECTROSURGICAL) ×1 IMPLANT
GAUZE SPONGE 4X4 12PLY STRL (GAUZE/BANDAGES/DRESSINGS) ×4 IMPLANT
GLOVE BIO SURGEON STRL SZ8 (GLOVE) ×2 IMPLANT
GLOVE BIOGEL PI IND STRL 7.0 (GLOVE) ×2 IMPLANT
GLOVE BIOGEL PI INDICATOR 7.0 (GLOVE) ×2
GLOVE SURG SS PI 6.5 STRL IVOR (GLOVE) ×1 IMPLANT
GOWN STRL REUS W/TWL LRG LVL3 (GOWN DISPOSABLE) ×2 IMPLANT
GOWN STRL REUS W/TWL XL LVL3 (GOWN DISPOSABLE) ×2 IMPLANT
KIT TURNOVER KIT A (KITS) ×2 IMPLANT
MANIFOLD NEPTUNE II (INSTRUMENTS) ×2 IMPLANT
NDL HYPO 25X1 1.5 SAFETY (NEEDLE) ×1 IMPLANT
NEEDLE HYPO 25X1 1.5 SAFETY (NEEDLE) ×2 IMPLANT
NS IRRIG 1000ML POUR BTL (IV SOLUTION) ×2 IMPLANT
PACK MINOR (CUSTOM PROCEDURE TRAY) ×2 IMPLANT
PAD ARMBOARD 7.5X6 YLW CONV (MISCELLANEOUS) ×2 IMPLANT
SET BASIN LINEN APH (SET/KITS/TRAYS/PACK) ×2 IMPLANT
SUPPORT SCROTAL LG STRP (MISCELLANEOUS) ×2 IMPLANT
SUT CHROMIC 3 0 SH 27 (SUTURE) ×3 IMPLANT
SUT VIC AB 3-0 SH 27 (SUTURE) ×2
SUT VIC AB 3-0 SH 27X BRD (SUTURE) ×1 IMPLANT
SYR CONTROL 10ML LL (SYRINGE) ×2 IMPLANT

## 2021-06-14 NOTE — Anesthesia Procedure Notes (Signed)
Procedure Name: LMA Insertion ?Date/Time: 06/14/2021 7:31 AM ?Performed by: Karna Dupes, CRNA ?Pre-anesthesia Checklist: Emergency Drugs available, Patient identified, Suction available and Patient being monitored ?Patient Re-evaluated:Patient Re-evaluated prior to induction ?Oxygen Delivery Method: Circle system utilized ?Preoxygenation: Pre-oxygenation with 100% oxygen ?Induction Type: IV induction ?LMA: LMA inserted ?LMA Size: 5.0 ?Number of attempts: 1 ?Placement Confirmation: positive ETCO2 and breath sounds checked- equal and bilateral ?Tube secured with: Tape ?Dental Injury: Teeth and Oropharynx as per pre-operative assessment  ? ? ? ? ?

## 2021-06-14 NOTE — H&P (Signed)
Assessment: ?1. Scrotal swelling   ?2. Hydrocele, left   ?  ?  ?Plan: ?Diagnosis and management of a hydrocele discussed with the patient.  Surgical management with a hydrocelectomy reviewed including potential risks.  I discussed the risk of infection, bleeding, injury to scrotal structures, possible recurrence of the hydrocele (25%), need for additional procedures, and anesthetic risk.  He would like to consider his options and we will discuss further after the ultrasound results are available. ?Left hydrocelectomy today. ?  ?Chief Complaint:  ?   ?Chief Complaint  ?Patient presents with  ? Hydrocele  ?  ?  ?History of Present Illness: ?  ?Willie Coleman is a 61 y.o. year old male who is seen for evaluation of left scrotal swelling.  This has been gradually increasing in size for the past 2-3 years.  No discomfort associated with the swelling.  No known history of scrotal trauma or infection.  No imaging studies have been performed.  He is status post a left hernia repair in the 1980s. ?He reports urinary frequency but no other lower urinary tract symptoms.  No dysuria or gross hematuria. ?IPSS = 3. ?Scrotal U/S showed a large left hydrocele, normal testicles bilaterally. ? ?He presents today for left hydrocelectomy. ?  ?  ?Past Medical History:  ?    ?Past Medical History:  ?Diagnosis Date  ? Hyperlipidemia    ? Hypertension    ? Sleep apnea    ?  ?  ?Past Surgical History:  ?     ?Past Surgical History:  ?Procedure Laterality Date  ? COLONOSCOPY N/A 08/29/2016  ?  Procedure: COLONOSCOPY;  Surgeon: Rogene Houston, MD;  Location: [facility];  Service: Endoscopy;  Laterality: N/A;  830  ? HERNIA REPAIR      ?  inguinal  ? TONSILLECTOMY      ?  ?  ?Allergies:  ?No Known Allergies ?  ?Family History:  ?Family History  ?Adopted: Yes  ?  ?  ?Social History:  ?Social History  ?  ?     ?Tobacco Use  ? Smoking status: Former  ?    Years: 30.00  ?    Types: Cigarettes  ?    Quit date: 09/19/2012  ?    Years since  quitting: 8.6  ? Smokeless tobacco: Never  ?Vaping Use  ? Vaping Use: Never used  ?Substance Use Topics  ? Alcohol use: Yes  ?    Comment: 2 drinks per week  ? Drug use: No  ?  ?  ?Review of symptoms:  ?Constitutional:  Negative for unexplained weight loss, night sweats, fever, chills ?ENT:  Negative for nose bleeds, sinus pain, painful swallowing ?CV:  Negative for chest pain, shortness of breath, exercise intolerance, palpitations, loss of consciousness ?Resp:  Negative for cough, wheezing, shortness of breath ?GI:  Negative for nausea, vomiting, diarrhea, bloody stools ?GU:  Positives noted in HPI; otherwise negative for gross hematuria, dysuria, urinary incontinence ?Neuro:  Negative for seizures, poor balance, limb weakness, slurred speech ?Psych:  Negative for lack of energy, depression, anxiety ?Endocrine:  Negative for polydipsia, polyuria, symptoms of hypoglycemia (dizziness, hunger, sweating) ?Hematologic:  Negative for anemia, purpura, petechia, prolonged or excessive bleeding, use of anticoagulants  ?Allergic:  Negative for difficulty breathing or choking as a result of exposure to anything; no shellfish allergy; no allergic response (rash/itch) to materials, foods ?  ?Physical exam: ?BP (!) 154/102   Pulse 89   Ht '5\' 11"'$  (1.803 m)  Wt (!) 303 lb (137.4 kg)   BMI 42.26 kg/m?  ?GENERAL APPEARANCE:  Well appearing, well developed, well nourished, NAD ?HEENT: Atraumatic, Normocephalic, oropharynx clear. ?NECK: Supple without lymphadenopathy or thyromegaly. ?LUNGS: Clear to auscultation bilaterally. ?HEART: Regular Rate and Rhythm without murmurs, gallops, or rubs. ?ABDOMEN: Soft, non-tender, No Masses. ?EXTREMITIES: Moves all extremities well.  Without clubbing, cyanosis, or edema. ?NEUROLOGIC:  Alert and oriented x 3, normal gait, CN II-XII grossly intact.  ?MENTAL STATUS:  Appropriate. ?BACK:  Non-tender to palpation.  No CVAT ?SKIN:  Warm, dry and intact.   ?GU: ?Penis:  uncircumcised ?Meatus:  Normal ?Scrotum: Enlargement of the left scrotum without erythema; nontender ?Testis: Right, normal; unable to palpate left secondary to scrotal swelling ?  ?  ?Results: ?None ?

## 2021-06-14 NOTE — Transfer of Care (Signed)
Immediate Anesthesia Transfer of Care Note ? ?Patient: Willie Coleman ? ?Procedure(s) Performed: HYDROCELECTOMY ADULT (Left: Scrotum) ? ?Patient Location: PACU ? ?Anesthesia Type:General ? ?Level of Consciousness: awake and alert  ? ?Airway & Oxygen Therapy: Patient Spontanous Breathing and Patient connected to face mask oxygen ? ?Post-op Assessment: Report given to RN and Post -op Vital signs reviewed and stable ? ?Post vital signs: Reviewed and stable ? ?Last Vitals:  ?Vitals Value Taken Time  ?BP 128/93   ?Temp 97.4   ?Pulse 70 06/14/21 0836  ?Resp 15 06/14/21 0836  ?SpO2 100 % 06/14/21 0836  ?Vitals shown include unvalidated device data. ? ?Last Pain:  ?Vitals:  ? 06/14/21 0642  ?TempSrc: Oral  ?PainSc: 0-No pain  ?   ? ?Patients Stated Pain Goal: 7 (06/14/21 7673) ? ?Complications: No notable events documented. ?

## 2021-06-14 NOTE — Anesthesia Postprocedure Evaluation (Signed)
Anesthesia Post Note ? ?Patient: Willie Coleman ? ?Procedure(s) Performed: HYDROCELECTOMY ADULT (Left: Scrotum) ? ?Patient location during evaluation: Phase II ?Anesthesia Type: General ?Level of consciousness: awake and alert and oriented ?Pain management: pain level controlled ?Vital Signs Assessment: post-procedure vital signs reviewed and stable ?Respiratory status: spontaneous breathing, nonlabored ventilation and respiratory function stable ?Cardiovascular status: blood pressure returned to baseline and stable ?Postop Assessment: no apparent nausea or vomiting ?Anesthetic complications: no ? ? ?No notable events documented. ? ? ?Last Vitals:  ?Vitals:  ? 06/14/21 0900 06/14/21 0919  ?BP: (!) 134/95 134/89  ?Pulse: 63 64  ?Resp: 14 16  ?Temp: 36.7 ?C 36.6 ?C  ?SpO2: 95% 94%  ?  ?Last Pain:  ?Vitals:  ? 06/14/21 0919  ?TempSrc: Oral  ?PainSc: 3   ? ? ?  ?  ?  ?  ?  ?  ? ?Linley Moxley C Kal Chait ? ? ? ? ?

## 2021-06-14 NOTE — Op Note (Signed)
OPERATIVE NOTE ? ? ?Patient Name: Willie Coleman ? ?MRN:  [id number] ? ?Date of Procedure: 06/14/21 ? ?Preoperative diagnosis:  ?Left hydrocele ? ?Postoperative diagnosis:  ?Left hydrocele ? ?Procedure:  ?Left hydrocelectomy ? ?Attending: Primus Bravo, MD ? ?Anesthesia: General with local ? ?Estimated blood loss: 5 mL ? ?Fluids: Per anesthesia record ? ?Drains: Penrose drain in left scrotum ? ?Specimens: Left hydrocele sac ? ?Antibiotics: Ancef 3 g IV ? ?Findings: Left hydrocele; normal left testicle and epididymis ? ?Indications:  ?61 year old male with left hydrocele presents for surgical management.  He has had gradually worsening swelling of the left scrotum for several years.  Scrotal ultrasound from 05/08/21 demonstrated normal testes bilaterally, small right-sided hydrocele, and an 11.1 x 4.6 x 8 cm left-sided hydrocele.  Options were discussed with the patient in detail.  He presents now for a left hydrocelectomy.  Risk and benefits of the procedure discussed in detail.  Potential risk of infection, bleeding, injury to scrotal structures, possible recurrence of the hydrocele, need for additional procedures, and anesthetic risk reviewed.  He understands wishes to proceed as described. ? ?Description of Procedure:  ?The patient received IV Ancef preoperatively.  After successful induction of a general anesthetic, the patient was placed on the operating room table in the supine position.  The patient's scrotum was prepped and draped in sterile fashion.  Examination revealed enlargement of the left hemiscrotum.  A incision was made in the midline of the scrotum along the median raphae.  The incision was carried down through the dartos fascia with the cautery.  The hydrocele sac was identified and carefully dissected free from overlying tissue.  The hydrocele sac was then delivered into the wound.  The hydrocele sac was opened and approximately 200 mL of straw-colored fluid was suctioned.  Examination  revealed a normal left testicle and epididymis.  The excess hydrocele sac was excised.  Specimen was sent to pathology.  The edges of the hydrocele sac were then oversewn using a running 3-0 Vicryl suture.  Excellent hemostasis was obtained.  The left testicle was placed back into the scrotum.  A 1/2 inch Penrose drain was then placed through a separate stab wound in the dependent portion of the left hemiscrotum and secured in place with a chromic suture.  The dartos layer was closed with a running 3-0 chromic suture.  6 mL of 0.2% plain Marcaine was injected into the incision.  The skin was then closed with a running 3-0 chromic suture.  Dermabond was applied.  Fluffs and a scrotal support were placed.  The patient was then extubated taken to the post anesthesia care unit in stable condition.  All counts were correct at the end the procedure. ? ?Complications: None ? ?Condition: Stable, extubated, transferred to PACU ? ?Plan:  ?Discharge home with postoperative instructions ?Return to office in 2 days for drain removal. ? ?

## 2021-06-14 NOTE — Interval H&P Note (Signed)
History and Physical Interval Note: ? ?06/14/2021 ?7:11 AM ? ?Willie Coleman  has presented today for surgery, with the diagnosis of hydrocele.  The various methods of treatment have been discussed with the patient and family. After consideration of risks, benefits and other options for treatment, the patient has consented to  Procedure(s): ?HYDROCELECTOMY ADULT (Left) as a surgical intervention.  The patient's history has been reviewed, patient examined, no change in status, stable for surgery.  I have reviewed the patient's chart and labs.  Questions were answered to the patient's satisfaction.   ? ? ?Leory Plowman Kendel Pesnell ? ? ?

## 2021-06-14 NOTE — Anesthesia Preprocedure Evaluation (Signed)
Anesthesia Evaluation  ?Patient identified by MRN, date of birth, ID band ?Patient awake ? ? ? ?Reviewed: ?Allergy & Precautions, NPO status , Patient's Chart, lab work & pertinent test results ? ?Airway ?Mallampati: II ? ?TM Distance: >3 FB ?Neck ROM: Full ? ? ? Dental ? ?(+) Dental Advisory Given ?No notable dental injury:   ?Pulmonary ?sleep apnea (snorin, apneic episodes) , former smoker,  ?  ?Pulmonary exam normal ?breath sounds clear to auscultation ? ? ? ? ? ? Cardiovascular ?Exercise Tolerance: Good ?hypertension, Pt. on medications ?Normal cardiovascular exam ?Rhythm:Regular Rate:Normal ? ? ?  ?Neuro/Psych ?PSYCHIATRIC DISORDERS negative neurological ROS ?   ? GI/Hepatic ?Neg liver ROS, GERD  Controlled,  ?Endo/Other  ?Morbid obesity ? Renal/GU ?negative Renal ROS  ?negative genitourinary ?  ?Musculoskeletal ?negative musculoskeletal ROS ?(+)  ? Abdominal ?  ?Peds ?negative pediatric ROS ?(+)  Hematology ?negative hematology ROS ?(+)   ?Anesthesia Other Findings ? ? Reproductive/Obstetrics ?negative OB ROS ? ?  ? ? ? ? ? ? ? ? ? ? ? ? ? ?  ?  ? ? ? ? ? ? ? ?Anesthesia Physical ?Anesthesia Plan ? ?ASA: 3 ? ?Anesthesia Plan: General  ? ?Post-op Pain Management: Dilaudid IV  ? ?Induction: Intravenous ? ?PONV Risk Score and Plan: 3 and Ondansetron and Dexamethasone ? ?Airway Management Planned: LMA ? ?Additional Equipment:  ? ?Intra-op Plan:  ? ?Post-operative Plan: Extubation in OR ? ?Informed Consent: I have reviewed the patients History and Physical, chart, labs and discussed the procedure including the risks, benefits and alternatives for the proposed anesthesia with the patient or authorized representative who has indicated his/her understanding and acceptance.  ? ? ? ? ? ?Plan Discussed with: CRNA and Surgeon ? ?Anesthesia Plan Comments:   ? ? ? ? ? ?Anesthesia Quick Evaluation ? ?

## 2021-06-15 ENCOUNTER — Encounter (HOSPITAL_COMMUNITY): Payer: Self-pay | Admitting: Urology

## 2021-06-15 LAB — SURGICAL PATHOLOGY

## 2021-06-16 ENCOUNTER — Ambulatory Visit (INDEPENDENT_AMBULATORY_CARE_PROVIDER_SITE_OTHER): Admitting: Urology

## 2021-06-16 ENCOUNTER — Encounter: Payer: Self-pay | Admitting: Urology

## 2021-06-16 VITALS — BP 159/106 | HR 101

## 2021-06-16 DIAGNOSIS — N433 Hydrocele, unspecified: Secondary | ICD-10-CM

## 2021-06-16 NOTE — Progress Notes (Signed)
   Assessment: 1. Hydrocele, left     Plan: Penrose drain removed today. Pathology results discussed with the patient Complete antibiotics. Continue postoperative restrictions. He may return to work on Wednesday, 06/21/2021 with light duty. Return to office in 3-4 weeks.  Chief Complaint: Chief Complaint  Patient presents with   Hydrocele    HPI: Willie Coleman is a 61 y.o. male who presents for continued evaluation of a left hydrocele. He is status post a left hydrocelectomy on 06/14/2021. Pathology consistent with benign hydrocele sac. He presents today for removal of the Penrose drain. He has done fairly well since the procedure.  His pain has been well controlled.  He is having some scrotal swelling.  He continues on his antibiotics.  Portions of the above documentation were copied from a prior visit for review purposes only.  Allergies: No Known Allergies  PMH: Past Medical History:  Diagnosis Date   Hyperlipidemia    Hypertension    Sleep apnea     PSH: Past Surgical History:  Procedure Laterality Date   COLONOSCOPY N/A 08/29/2016   Procedure: COLONOSCOPY;  Surgeon: Rogene Houston, MD;  Location: [facility];  Service: Endoscopy;  Laterality: N/A;  [place]     inguinal   HYDROCELE EXCISION Left 06/14/2021   Procedure: HYDROCELECTOMY ADULT;  Surgeon: Primus Bravo., MD;  Location: [facility];  Service: Urology;  Laterality: Left;   TONSILLECTOMY      SH: Social History   Tobacco Use   Smoking status: Former    Years: 30.00    Types: Cigarettes    Quit date: 09/19/2012    Years since quitting: 8.7   Smokeless tobacco: Never  Vaping Use   Vaping Use: Never used  Substance Use Topics   Alcohol use: Yes    Comment: 2 drinks per week   Drug use: No    ROS: Constitutional:  Negative for fever, chills, weight loss CV: Negative for chest pain, previous MI, hypertension Respiratory:  Negative for shortness of breath, wheezing, sleep  apnea, frequent cough GI:  Negative for nausea, vomiting, bloody stool, GERD  PE: BP (!) 159/106   Pulse (!) 101  GU: Swelling of left scrotum without erythema; incision intact; Penrose drain in place  Results: None

## 2021-06-27 ENCOUNTER — Telehealth: Payer: Self-pay | Admitting: Neurology

## 2021-06-27 NOTE — Telephone Encounter (Signed)
[employer] no auth, patient HST is scheduled for 07/05/21 at 8:30 AM.

## 2021-07-02 ENCOUNTER — Other Ambulatory Visit: Payer: Self-pay | Admitting: Family Medicine

## 2021-07-03 NOTE — Telephone Encounter (Signed)
Requested Prescriptions  Pending Prescriptions Disp Refills  . amLODipine (NORVASC) 10 MG tablet [Pharmacy Med Name: AMLODIPINE BESYLATE '10MG'$  TABLETS] 90 tablet 3    Sig: TAKE 1 TABLET BY MOUTH DAILY     Cardiovascular: Calcium Channel Blockers 2 Failed - 07/02/2021 10:33 AM      Failed - Last BP in normal range    BP Readings from Last 1 Encounters:  06/16/21 (!) 159/106         Failed - Valid encounter within last 6 months    Recent Outpatient Visits          2 months ago General medical exam   [place] Susy Frizzle, MD   1 year ago Elevated blood sugar   [place] Dennard Schaumann, Cammie Mcgee, MD   3 years ago General medical exam   [place] Susy Frizzle, MD   4 years ago General medical exam   [place] Susy Frizzle, MD   5 years ago Situational anxiety   [place], Cammie Mcgee, MD      Future Appointments            In 3 days [place], Reece Leader., MD [place] [facility]            Passed - Last Heart Rate in normal range    Pulse Readings from Last 1 Encounters:  06/16/21 (!) 101

## 2021-07-05 ENCOUNTER — Ambulatory Visit: Admitting: Neurology

## 2021-07-05 DIAGNOSIS — R0683 Snoring: Secondary | ICD-10-CM

## 2021-07-05 DIAGNOSIS — Z9189 Other specified personal risk factors, not elsewhere classified: Secondary | ICD-10-CM

## 2021-07-05 DIAGNOSIS — R600 Localized edema: Secondary | ICD-10-CM

## 2021-07-05 DIAGNOSIS — R519 Headache, unspecified: Secondary | ICD-10-CM

## 2021-07-05 DIAGNOSIS — G4719 Other hypersomnia: Secondary | ICD-10-CM

## 2021-07-05 DIAGNOSIS — R0689 Other abnormalities of breathing: Secondary | ICD-10-CM

## 2021-07-05 DIAGNOSIS — G4733 Obstructive sleep apnea (adult) (pediatric): Secondary | ICD-10-CM | POA: Diagnosis not present

## 2021-07-06 ENCOUNTER — Encounter: Payer: Self-pay | Admitting: Urology

## 2021-07-06 ENCOUNTER — Ambulatory Visit (INDEPENDENT_AMBULATORY_CARE_PROVIDER_SITE_OTHER): Admitting: Urology

## 2021-07-06 VITALS — BP 148/93 | HR 92 | Ht 71.0 in | Wt 302.0 lb

## 2021-07-06 DIAGNOSIS — N433 Hydrocele, unspecified: Secondary | ICD-10-CM

## 2021-07-06 NOTE — Progress Notes (Signed)
   Assessment: 1. Hydrocele, left     Plan: Return to office in 6 weeks  Chief Complaint: Chief Complaint  Patient presents with   Hydrocele    HPI: Willie Coleman is a 61 y.o. male who presents for continued evaluation of a left hydrocele. He is status post a left hydrocelectomy on 06/14/2021. Pathology consistent with benign hydrocele sac. His drain was removed on postoperative day #2.  He returns today for follow-up.  He is doing well.  His scrotal swelling is resolving.  No pain.  No drainage from the incision site.  Portions of the above documentation were copied from a prior visit for review purposes only.  Allergies: No Known Allergies  PMH: Past Medical History:  Diagnosis Date   Hyperlipidemia    Hypertension    Sleep apnea     PSH: Past Surgical History:  Procedure Laterality Date   COLONOSCOPY N/A 08/29/2016   Procedure: COLONOSCOPY;  Surgeon: Rogene Houston, MD;  [facility];  Service: Endoscopy;  Laterality: N/A;  [place]     inguinal   HYDROCELE EXCISION Left 06/14/2021   Procedure: HYDROCELECTOMY ADULT;  Surgeon: Primus Bravo., MD;  Location: [facility];  Service: Urology;  Laterality: Left;   TONSILLECTOMY      SH: Social History   Tobacco Use   Smoking status: Former    Years: 30.00    Types: Cigarettes    Quit date: 09/19/2012    Years since quitting: 8.8   Smokeless tobacco: Never  Vaping Use   Vaping Use: Never used  Substance Use Topics   Alcohol use: Yes    Comment: 2 drinks per week   Drug use: No    ROS: Constitutional:  Negative for fever, chills, weight loss CV: Negative for chest pain, previous MI, hypertension Respiratory:  Negative for shortness of breath, wheezing, sleep apnea, frequent cough GI:  Negative for nausea, vomiting, bloody stool, GERD  PE: BP (!) 148/93   Pulse 92   Ht '5\' 11"'$  (1.803 m)   Wt (!) 302 lb (137 kg)   BMI 42.12 kg/m  GU: scrotal incision well healed; minimal left  scrotal swelling, no erythema, non-tender  Results: None

## 2021-07-12 NOTE — Progress Notes (Signed)
See procedure note.

## 2021-07-13 NOTE — Addendum Note (Signed)
Addended by: Star Age on: 07/13/2021 04:45 PM   Modules accepted: Orders

## 2021-07-13 NOTE — Procedures (Signed)
   [facility]  HOME SLEEP TEST (Watch PAT) REPORT  STUDY DATE: 07/05/2021  DOB: 11-04-1960  MRN: [id number]  ORDERING CLINICIAN: Star Age, MD, PhD   REFERRING CLINICIAN: Susy Frizzle, MD   CLINICAL INFORMATION/HISTORY: 61 year old right-handed gentleman with an underlying medical history of prediabetes, hyperlipidemia, elevated blood pressure values, and severe obesity with a BMI of over 40, who reports snoring and excessive daytime somnolence, as well as nonrestorative sleep.  He has woken up with a sense of gasping for air.   Epworth sleepiness score: 6/24.  BMI: 42.9 kg/m  FINDINGS:   Sleep Summary:   Total Recording Time (hours, min): 6 hours, 36 minutes  Total Sleep Time (hours, min):  5 hours, 58 minutes   Percent REM (%):    21.1%   Respiratory Indices:   Calculated pAHI (per hour):  49.9/hour         REM pAHI:    62.6/hour       NREM pAHI: 46.4/hour  Oxygen Saturation Statistics:    Oxygen Saturation (%) Mean: 93%   Minimum oxygen saturation (%):                 81%   O2 Saturation Range (%): 81-99%    O2 Saturation (minutes) <=88%: 7.2 min  Pulse Rate Statistics:   Pulse Mean (bpm):    68/min    Pulse Range (50-97/min)   IMPRESSION: OSA (obstructive sleep apnea), severe  RECOMMENDATION:  This home sleep test demonstrates severe obstructive sleep apnea with a total AHI of 49.9/hour and O2 nadir of 81%.  Snoring was detected in the mild to moderate range, at times in the loud range.  Treatment with positive airway pressure is highly recommended. This will require - ideally - a full night CPAP titration study for proper treatment settings, O2 monitoring and mask fitting. For now, the patient will be advised to proceed with an autoPAP titration/trial at home.  A laboratory attended titration study can be considered in the future for optimization of his treatment and better tolerance of therapy.  Alternative treatment options are  limited secondary to the severity of the patient's sleep disordered breathing.  Concomitant weight loss is highly recommended. Please note, that untreated obstructive sleep apnea may carry additional perioperative morbidity. Patients with significant obstructive sleep apnea should receive perioperative PAP therapy and the surgeons and particularly the anesthesiologist should be informed of the diagnosis and the severity of the sleep disordered breathing. The patient should be cautioned not to drive, work at heights, or operate dangerous or heavy equipment when tired or sleepy. Review and reiteration of good sleep hygiene measures should be pursued with any patient. Other causes of the patient's symptoms, including circadian rhythm disturbances, an underlying mood disorder, medication effect and/or an underlying medical problem cannot be ruled out based on this test. Clinical correlation is recommended. The patient and his referring provider will be notified of the test results. The patient will be seen in follow up in sleep clinic at [facility].  I certify that I have reviewed the raw data recording prior to the issuance of this report in accordance with the standards of the American Academy of Sleep Medicine (AASM).   INTERPRETING PHYSICIAN:   Star Age, MD, PhD  Board Certified in Neurology and Sleep Medicine  [facility] [address] [place], [place] [address] [phone]

## 2021-07-18 ENCOUNTER — Telehealth: Payer: Self-pay

## 2021-07-18 NOTE — Telephone Encounter (Signed)
-----   Message from Star Age, MD sent at 07/13/2021  4:45 PM EDT ----- Patient referred by Dr. Dennard Schaumann, seen by me on 06/08/2021, patient had a HST on 07/05/2021.    Please call and notify the patient that the recent home sleep test showed obstructive sleep apnea in the severe range. I recommend treatment for this in the form of autoPAP, which means, that we don't have to bring him in for a sleep study with CPAP, but will let him start using a so called autoPAP machine at home, through a DME company (of his choice, or as per insurance requirement). The DME representative will fit the patient with a mask of choice, educate him on how to use the machine, how to put the mask on, etc. I have placed an order in the chart. Please send the order to a local DME, talk to patient, send report to referring MD. Please also reinforce the need for compliance with treatment. We will need a FU in sleep clinic for 10 weeks post-PAP set up, please arrange that with me or one of our NPs. Thanks,   Star Age, MD, PhD [facility])

## 2021-07-18 NOTE — Telephone Encounter (Signed)
I called patient to discuss. No answer, left a message asking him to call me back. If patient calls back another day please route to POD 4. 

## 2021-08-17 ENCOUNTER — Ambulatory Visit: Admitting: Urology

## 2021-08-18 ENCOUNTER — Ambulatory Visit: Admitting: Urology

## 2021-08-18 NOTE — Progress Notes (Deleted)
   Assessment: 1. Hydrocele, left     Plan: Return to office prn  Chief Complaint: No chief complaint on file.   HPI: Carol Loftin is a 61 y.o. male who presents for continued evaluation of a left hydrocele. He is status post a left hydrocelectomy on 06/14/2021. Pathology consistent with benign hydrocele sac. His drain was removed on postoperative day #2.  He returns today for follow-up.  He is doing well.  His scrotal swelling is resolving.  No pain.  No drainage from the incision site.  Portions of the above documentation were copied from a prior visit for review purposes only.  Allergies: No Known Allergies  PMH: Past Medical History:  Diagnosis Date   Hyperlipidemia    Hypertension    Sleep apnea     PSH: Past Surgical History:  Procedure Laterality Date   COLONOSCOPY N/A 08/29/2016   Procedure: COLONOSCOPY;  Surgeon: Rogene Houston, MD;  [facility];  Service: Endoscopy;  Laterality: N/A;  [place]     inguinal   HYDROCELE EXCISION Left 06/14/2021   Procedure: HYDROCELECTOMY ADULT;  Surgeon: Primus Bravo., MD;  Location: [facility];  Service: Urology;  Laterality: Left;   TONSILLECTOMY      SH: Social History   Tobacco Use   Smoking status: Former    Years: 30.00    Types: Cigarettes    Quit date: 09/19/2012    Years since quitting: 8.9   Smokeless tobacco: Never  Vaping Use   Vaping Use: Never used  Substance Use Topics   Alcohol use: Yes    Comment: 2 drinks per week   Drug use: No    ROS: Constitutional:  Negative for fever, chills, weight loss CV: Negative for chest pain, previous MI, hypertension Respiratory:  Negative for shortness of breath, wheezing, sleep apnea, frequent cough GI:  Negative for nausea, vomiting, bloody stool, GERD  PE: There were no vitals taken for this visit. GU:   Results: None

## 2021-10-25 NOTE — Progress Notes (Unsigned)
[facility] [address] [place] [address] [phone]       OFFICE FOLLOW UP NOTE  [name] Date of Birth:  06-21-1960 Medical Record Number:  [id number]   Reason for visit: Initial CPAP follow-up    SUBJECTIVE:   CHIEF COMPLAINT:  No chief complaint on file.   HPI:   Update 10/26/2021 JM: Patient is being seen for initial CPAP compliance visit.  Completed HST 6/7 which showed severe OSA with total AHI of 49.9/h and O2 nadir of 81%. Initiated AutoPap on 7/11.       Consult visit 06/08/2021 Dr. Rexene Alberts: [name] is a 61 year old right-handed gentleman with an underlying medical history of prediabetes, hyperlipidemia, elevated blood pressure values, and severe obesity with a BMI of over 40, who reports snoring and excessive daytime somnolence, as well as nonrestorative sleep.  He has woken up with a sense of gasping for air.  He occasionally wakes up with a dull headache.  He has no night to night nocturia.  I reviewed your office note from 04/18/2021.  His Epworth sleepiness score is 6 out of 24, fatigue severity score is 45 out of 63.  He lives alone, he has grown children, ages 49 and 79.  He has no pets in the household, he does have a TV in the bedroom but turns it off before falling asleep.  Bedtime is generally around 10 PM and rise time during the workweek around 5 AM.  On the weekend he may sleep till 6:30 AM.  He drinks caffeine in the form of coffee, about 2 cups/day, occasional alcohol, quit smoking over 15 years ago.  He works in Land at [employer].  He does not wake up rested.  He has gained weight in the past couple of years.  He is working on weight loss now.   ROS:   14 system review of systems performed and negative with exception of ***  PMH:  Past Medical History:  Diagnosis Date   Hyperlipidemia    Hypertension    Sleep apnea     PSH:  Past Surgical History:  Procedure Laterality Date   COLONOSCOPY N/A 08/29/2016    Procedure: COLONOSCOPY;  Surgeon: Rogene Houston, MD;  [facility];  Service: Endoscopy;  Laterality: N/A;  [place]     inguinal   HYDROCELE EXCISION Left 06/14/2021   Procedure: HYDROCELECTOMY ADULT;  Surgeon: Primus Bravo., MD;  Location: [facility];  Service: Urology;  Laterality: Left;   TONSILLECTOMY      Social History:  Social History   Socioeconomic History   Marital status: Divorced    Spouse name: Not on file   Number of children: Not on file   Years of education: Not on file   Highest education level: Not on file  Occupational History   Not on file  Tobacco Use   Smoking status: Former    Years: 30.00    Types: Cigarettes    Quit date: 09/19/2012    Years since quitting: 9.1   Smokeless tobacco: Never  Vaping Use   Vaping Use: Never used  Substance and Sexual Activity   Alcohol use: Yes    Comment: 2 drinks per week   Drug use: No   Sexual activity: Not on file    Comment: adopted  Other Topics Concern   Not on file  Social History Narrative   Not on file   Social Determinants of Health  Financial Resource Strain: Not on file  Food Insecurity: Not on file  Transportation Needs: Not on file  Physical Activity: Not on file  Stress: Not on file  Social Connections: Not on file  Intimate Partner Violence: Not on file    Family History:  Family History  Adopted: Yes  Problem Relation Age of Onset   Sleep apnea Neg Hx     Medications:   Current Outpatient Medications on File Prior to Visit  Medication Sig Dispense Refill   amLODipine (NORVASC) 10 MG tablet TAKE 1 TABLET BY MOUTH DAILY 90 tablet 3   HYDROcodone-acetaminophen (NORCO/VICODIN) 5-325 MG tablet Take 1-2 tablets by mouth every 6 (six) hours as needed for moderate pain. 20 tablet 0   Multiple Vitamin (MULTIVITAMIN WITH MINERALS) TABS tablet Take 1 tablet by mouth daily.     rosuvastatin (CRESTOR) 10 MG tablet Take 1 tablet (10 mg total) by mouth daily. 90  tablet 3   No current facility-administered medications on file prior to visit.    Allergies:  No Known Allergies    OBJECTIVE:  Physical Exam  There were no vitals filed for this visit. There is no height or weight on file to calculate BMI. No results found.   General: well developed, well nourished, seated, in no evident distress Head: head normocephalic and atraumatic.   Neck: supple with no carotid or supraclavicular bruits Cardiovascular: regular rate and rhythm, no murmurs Musculoskeletal: no deformity Skin:  no rash/petichiae Vascular:  Normal pulses all extremities   Neurologic Exam Mental Status: Awake and fully alert. Oriented to place and time. Recent and remote memory intact. Attention span, concentration and fund of knowledge appropriate. Mood and affect appropriate.  Cranial Nerves: Pupils equal, briskly reactive to light. Extraocular movements full without nystagmus. Visual fields full to confrontation. Hearing intact. Facial sensation intact. Face, tongue, palate moves normally and symmetrically.  Motor: Normal bulk and tone. Normal strength in all tested extremity muscles Sensory.: intact to touch , pinprick , position and vibratory sensation.  Coordination: Rapid alternating movements normal in all extremities. Finger-to-nose and heel-to-shin performed accurately bilaterally. Gait and Station: Arises from chair without difficulty. Stance is normal. Gait demonstrates normal stride length and balance without use of AD. Tandem walk and heel toe without difficulty.  Reflexes: 1+ and symmetric. Toes downgoing.         ASSESSMENT/PLAN: Willie Coleman is a 61 y.o. year old male ***.     OSA on CPAP : Compliance report shows satisfactory usage with optimal residual AHI.  Discussed continued nightly usage with ensuring greater than 4 hours nightly for optimal benefit and per insurance purposes.  Continue to follow with DME company for any needed supplies or CPAP  related concerns     Follow up in *** or call earlier if needed   CC:  PCP: Susy Frizzle, MD    I spent *** minutes of face-to-face and non-face-to-face time with patient.  This included previsit chart review, lab review, study review, order entry, electronic health record documentation, patient education regarding diagnosis of sleep apnea with review and discussion of compliance report and answered all other questions to patient's satisfaction   Frann Rider, [facility]  [facility] [address] [place], [place] [address]  Phone [phone] Fax [phone] Note: This document was prepared with digital dictation and possible smart phrase technology. Any transcriptional errors that result from this process are unintentional.

## 2021-10-26 ENCOUNTER — Ambulatory Visit: Admitting: Adult Health

## 2021-10-26 ENCOUNTER — Encounter: Payer: Self-pay | Admitting: Adult Health

## 2021-10-26 VITALS — BP 158/97 | HR 78 | Ht 71.0 in | Wt 317.2 lb

## 2021-10-26 DIAGNOSIS — G4733 Obstructive sleep apnea (adult) (pediatric): Secondary | ICD-10-CM

## 2021-10-26 DIAGNOSIS — Z9989 Dependence on other enabling machines and devices: Secondary | ICD-10-CM

## 2021-10-26 NOTE — Progress Notes (Signed)
Community message sent to [id number] on file.  New orders have been placed for the above pt, DOB: 28-Jan-1961 Thanks

## 2022-01-03 ENCOUNTER — Encounter: Payer: Self-pay | Admitting: Registered Nurse

## 2022-01-03 ENCOUNTER — Telehealth: Payer: Self-pay | Admitting: Registered Nurse

## 2022-01-03 ENCOUNTER — Encounter: Payer: Self-pay | Admitting: Family Medicine

## 2022-01-03 ENCOUNTER — Ambulatory Visit: Admitting: Occupational Medicine

## 2022-01-03 VITALS — BP 150/100 | HR 81

## 2022-01-03 DIAGNOSIS — R0789 Other chest pain: Secondary | ICD-10-CM

## 2022-01-03 NOTE — Progress Notes (Signed)
Patient states don't fell well. Like before I got CPAP. States cpap app reports normal readings. Reports chest tightness and foggy. Denies SHOB numbness tingling dizziness. Steady gait. Advised to be evaluated. Patient reports will see his PCP. Told him if can't see PCP to go to UC or ER due to Chest tightness and Foggy. Hx of HTN and hyperlipemia. [place] NP made aware.

## 2022-01-04 NOTE — Telephone Encounter (Signed)
Noted appt scheduled with [facility] 01/12/22 for labs and office visit in epic will follow up with patient today in person if onsite or telephone if offsite.

## 2022-01-05 NOTE — Telephone Encounter (Signed)
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  He reported started flexing in November.  Was sore initially.  Feeling okay today.  Stated my head still feels like before I started CPAP today.  Denied concerns or questions and has appt scheduled with PCM for next week.  A&Ox3 refused office visit with me or RN Kimrey today respirations even and unlabored skin warm dry and pink gait sure and steady lifting boxes/moving them on cart to be loaded after packing items. Patient notified can come to clinic if symptoms or concerns arise today.  Patient verbalized understanding information/instructions and had no further questions at that time.

## 2022-01-12 ENCOUNTER — Ambulatory Visit: Admitting: Family Medicine

## 2022-01-19 ENCOUNTER — Encounter: Payer: Self-pay | Admitting: Family Medicine

## 2022-01-19 ENCOUNTER — Ambulatory Visit: Admitting: Family Medicine

## 2022-01-19 VITALS — BP 146/92 | HR 77 | Ht 71.0 in | Wt 322.0 lb

## 2022-01-19 DIAGNOSIS — R7303 Prediabetes: Secondary | ICD-10-CM | POA: Diagnosis not present

## 2022-01-19 DIAGNOSIS — I10 Essential (primary) hypertension: Secondary | ICD-10-CM | POA: Diagnosis not present

## 2022-01-19 MED ORDER — VALSARTAN 160 MG PO TABS: 160.0000 mg | ORAL_TABLET | Freq: Every day | ORAL | 3 refills | Status: DC

## 2022-01-19 NOTE — Progress Notes (Signed)
Subjective:    Patient ID: Willie Coleman, male    DOB: 24-Apr-1960, 61 y.o.   MRN: [id number]  HPI Patient has a history of hypertension and prediabetes.  Recently at work his blood pressure was found to be 150/100.  He is elevated here today as well.  He denies any chest pain or shortness of breath.  However his weight is up to 322 pounds and his BMI is up to 45 Past Medical History:  Diagnosis Date   Hyperlipidemia    Hypertension    Sleep apnea    auto PAP   Past Surgical History:  Procedure Laterality Date   COLONOSCOPY N/A 08/29/2016   Procedure: COLONOSCOPY;  Surgeon: Rogene Houston, MD;  [facility];  Service: Endoscopy;  Laterality: N/A;  [place]     inguinal   HYDROCELE EXCISION Left 06/14/2021   Procedure: HYDROCELECTOMY ADULT;  Surgeon: Primus Bravo., MD;  Location: [facility];  Service: Urology;  Laterality: Left;   TONSILLECTOMY     Current Outpatient Medications on File Prior to Visit  Medication Sig Dispense Refill   amLODipine (NORVASC) 10 MG tablet TAKE 1 TABLET BY MOUTH DAILY 90 tablet 3   Multiple Vitamin (MULTIVITAMIN WITH MINERALS) TABS tablet Take 1 tablet by mouth daily.     rosuvastatin (CRESTOR) 10 MG tablet Take 1 tablet (10 mg total) by mouth daily. 90 tablet 3   No current facility-administered medications on file prior to visit.   No Known Allergies Social History   Socioeconomic History   Marital status: Divorced    Spouse name: Not on file   Number of children: Not on file   Years of education: Not on file   Highest education level: Not on file  Occupational History   Not on file  Tobacco Use   Smoking status: Former    Years: 30.00    Types: Cigarettes    Quit date: 09/19/2012    Years since quitting: 9.3   Smokeless tobacco: Never  Vaping Use   Vaping Use: Never used  Substance and Sexual Activity   Alcohol use: Yes    Comment: 2 drinks per week   Drug use: No   Sexual activity: Not on file    Comment:  adopted  Other Topics Concern   Not on file  Social History Narrative   Not on file   Social Determinants of Health   Financial Resource Strain: Not on file  Food Insecurity: Not on file  Transportation Needs: Not on file  Physical Activity: Not on file  Stress: Not on file  Social Connections: Not on file  Intimate Partner Violence: Not on file      Review of Systems     Objective:   Physical Exam Vitals reviewed.  Constitutional:      Appearance: Normal appearance. He is obese.  Cardiovascular:     Rate and Rhythm: Normal rate and regular rhythm.     Heart sounds: Normal heart sounds. No murmur heard. Pulmonary:     Effort: Pulmonary effort is normal.     Breath sounds: Normal breath sounds. No wheezing or rales.  Musculoskeletal:     Right lower leg: No edema.     Left lower leg: No edema.  Neurological:     Mental Status: He is alert.           Assessment & Plan:   Prediabetes - Plan: Hemoglobin A1c, Lipid panel, COMPLETE METABOLIC PANEL WITH GFR  Benign essential HTN  Patient has prediabetes and hypertension.  Continue amlodipine 10 mg daily but add valsartan 160 mg a day and recheck blood pressure in 1 week.  Check CMP, lipid panel, and A1c.  If A1c is greater than 6.5, we will try to get the patient started on Ozempic both for diabetes and weight loss.

## 2022-01-20 LAB — COMPLETE METABOLIC PANEL WITH GFR
AG Ratio: 1.7 (calc) (ref 1.0–2.5)
ALT: 39 U/L (ref 9–46)
AST: 25 U/L (ref 10–35)
Albumin: 4.5 g/dL (ref 3.6–5.1)
Alkaline phosphatase (APISO): 74 U/L (ref 35–144)
BUN: 11 mg/dL (ref 7–25)
CO2: 27 mmol/L (ref 20–32)
Calcium: 9.6 mg/dL (ref 8.6–10.3)
Chloride: 103 mmol/L (ref 98–110)
Creat: 1.15 mg/dL (ref 0.70–1.35)
Globulin: 2.7 g/dL (calc) (ref 1.9–3.7)
Glucose, Bld: 115 mg/dL — ABNORMAL HIGH (ref 65–99)
Potassium: 4.9 mmol/L (ref 3.5–5.3)
Sodium: 140 mmol/L (ref 135–146)
Total Bilirubin: 0.6 mg/dL (ref 0.2–1.2)
Total Protein: 7.2 g/dL (ref 6.1–8.1)
eGFR: 73 mL/min/{1.73_m2} (ref >=60)

## 2022-01-20 LAB — HEMOGLOBIN A1C
Hgb A1c MFr Bld: 6.1 % of total Hgb — ABNORMAL HIGH (ref <5.7)
Mean Plasma Glucose: 128 mg/dL
eAG (mmol/L): 7.1 mmol/L

## 2022-01-20 LAB — LIPID PANEL
Cholesterol: 223 mg/dL — ABNORMAL HIGH (ref <200)
HDL: 54 mg/dL (ref >=40)
LDL Cholesterol (Calc): 142 mg/dL (calc) — ABNORMAL HIGH
Non-HDL Cholesterol (Calc): 169 mg/dL (calc) — ABNORMAL HIGH (ref <130)
Total CHOL/HDL Ratio: 4.1 (calc) (ref <5.0)
Triglycerides: 142 mg/dL (ref <150)

## 2022-01-23 ENCOUNTER — Other Ambulatory Visit: Payer: Self-pay

## 2022-01-23 DIAGNOSIS — I10 Essential (primary) hypertension: Secondary | ICD-10-CM

## 2022-01-23 MED ORDER — SEMAGLUTIDE-WEIGHT MANAGEMENT 0.25 MG/0.5ML ~~LOC~~ SOAJ: 0.2500 mg | SUBCUTANEOUS | 1 refills | Status: DC

## 2022-02-21 ENCOUNTER — Telehealth: Payer: Self-pay

## 2022-02-21 NOTE — Telephone Encounter (Signed)
PA FOR WEGOVYEzra Coleman (Key: [id number])  [employer] is reviewing your PA request and will respond within 24 hours for Medicaid or up to 72 hours for non-Medicaid plans, based on the required timeframe determined by state or federal regulations. To check for an update later, open this request from your dashboard.

## 2022-03-12 ENCOUNTER — Ambulatory Visit: Payer: Self-pay | Admitting: Occupational Medicine

## 2022-03-12 VITALS — BP 142/98

## 2022-03-12 DIAGNOSIS — I10 Essential (primary) hypertension: Secondary | ICD-10-CM

## 2022-04-25 NOTE — Progress Notes (Unsigned)
[facility] [address] [place]. [place] [address] [phone]       OFFICE FOLLOW UP NOTE  [name] Date of Birth:  16-Dec-1960 Medical Record Number:  [id number]   Reason for visit: Initial CPAP follow-up    SUBJECTIVE:   CHIEF COMPLAINT:  No chief complaint on file.  Follow-up visit:  Prior visit: 10/06/2021   Brief HPI:   Willie Coleman is a 62 y.o. male who is being followed for OSA on CPAP.  Initially seen by Dr. Rexene Alberts 06/08/2021 for concern of underlying sleep apnea with complaints of snoring, excessive daytime somnolence and nonrestorative sleep. Completed HST 6/7 which showed severe OSA with total AHI of 49.9/h and O2 nadir of 81%. Initiated AutoPap on 7/11.  At prior visit, compliance report showed satisfactory usage and optimal residual AHI.  Tolerating CPAP well.  Interval history:     Epworth sleepiness scale 4/25.  Fatigue severity scale 28/63.           ROS:   14 system review of systems performed and negative with exception of those listed in HPI  PMH:  Past Medical History:  Diagnosis Date   Hyperlipidemia    Hypertension    Sleep apnea    auto PAP    PSH:  Past Surgical History:  Procedure Laterality Date   COLONOSCOPY N/A 08/29/2016   Procedure: COLONOSCOPY;  Surgeon: Rogene Houston, MD;  Location: [facility];  Service: Endoscopy;  Laterality: N/A;  [place]     inguinal   HYDROCELE EXCISION Left 06/14/2021   Procedure: HYDROCELECTOMY ADULT;  Surgeon: Primus Bravo., MD;  Location: [facility] ORS;  Service: Urology;  Laterality: Left;   TONSILLECTOMY      Social History:  Social History   Socioeconomic History   Marital status: Divorced    Spouse name: Not on file   Number of children: Not on file   Years of education: Not on file   Highest education level: Not on file  Occupational History   Not on file  Tobacco Use   Smoking status: Former    Years: 52    Types: Cigarettes     Quit date: 09/19/2012    Years since quitting: 9.6   Smokeless tobacco: Never  Vaping Use   Vaping Use: Never used  Substance and Sexual Activity   Alcohol use: Yes    Comment: 2 drinks per week   Drug use: No   Sexual activity: Not on file    Comment: adopted  Other Topics Concern   Not on file  Social History Narrative   Not on file   Social Determinants of Health   Financial Resource Strain: Not on file  Food Insecurity: Not on file  Transportation Needs: Not on file  Physical Activity: Not on file  Stress: Not on file  Social Connections: Not on file  Intimate Partner Violence: Not on file    Family History:  Family History  Adopted: Yes  Problem Relation Age of Onset   Sleep apnea Neg Hx     Medications:   Current Outpatient Medications on File Prior to Visit  Medication Sig Dispense Refill   Semaglutide-Weight Management 0.25 MG/0.5ML SOAJ Inject 0.25 mg into the skin once a week. 2 mL 1   amLODipine (NORVASC) 10 MG tablet TAKE 1 TABLET BY MOUTH DAILY 90 tablet 3   Multiple Vitamin (MULTIVITAMIN WITH MINERALS) TABS tablet Take 1 tablet by mouth daily.  rosuvastatin (CRESTOR) 10 MG tablet Take 1 tablet (10 mg total) by mouth daily. 90 tablet 3   valsartan (DIOVAN) 160 MG tablet Take 1 tablet (160 mg total) by mouth daily. 90 tablet 3   No current facility-administered medications on file prior to visit.    Allergies:  No Known Allergies    OBJECTIVE:  Physical Exam  There were no vitals filed for this visit.  There is no height or weight on file to calculate BMI. No results found.   General: well developed, well nourished, middle-aged Caucasian male, seated, in no evident distress Head: head normocephalic and atraumatic.   Neck: supple with no carotid or supraclavicular bruits Cardiovascular: regular rate and rhythm, no murmurs Musculoskeletal: no deformity Skin:  no rash/petichiae Vascular:  Normal pulses all extremities   Neurologic  Exam Mental Status: Awake and fully alert. Oriented to place and time. Recent and remote memory intact. Attention span, concentration and fund of knowledge appropriate. Mood and affect appropriate.  Cranial Nerves: Pupils equal, briskly reactive to light. Extraocular movements full without nystagmus. Visual fields full to confrontation. Hearing intact. Facial sensation intact. Face, tongue, palate moves normally and symmetrically.  Motor: Normal bulk and tone. Normal strength in all tested extremity muscles Sensory.: intact to touch , pinprick , position and vibratory sensation.  Coordination: Rapid alternating movements normal in all extremities. Finger-to-nose and heel-to-shin performed accurately bilaterally. Gait and Station: Arises from chair without difficulty. Stance is normal. Gait demonstrates normal stride length and balance without use of AD. Reflexes: 1+ and symmetric. Toes downgoing.         ASSESSMENT/PLAN: Willie Coleman is a 61 y.o. year old male     OSA on CPAP : Compliance report shows satisfactory usage with optimal residual AHI.  Advised to reach out to DME company if issues with water chamber persist or worsen. Discussed continued nightly usage with ensuring greater than 4 hours nightly for optimal benefit and per insurance purposes.  Continue to follow with DME company for any needed supplies or CPAP related concerns     Follow up in 6 months or call earlier if needed   CC:  PCP: Susy Frizzle, MD    I spent 24 minutes of face-to-face and non-face-to-face time with patient.  This included previsit chart review, lab review, study review, order entry, electronic health record documentation, patient education regarding diagnosis of sleep apnea with review and discussion of compliance report and answered all other questions to patient's satisfaction   Frann Rider, [facility]  [facility] [address] [place], [place]  [address]  Phone [phone] Fax [phone] Note: This document was prepared with digital dictation and possible smart phrase technology. Any transcriptional errors that result from this process are unintentional.

## 2022-04-26 ENCOUNTER — Ambulatory Visit: Admitting: Adult Health

## 2022-04-26 ENCOUNTER — Encounter: Payer: Self-pay | Admitting: Adult Health

## 2022-04-26 VITALS — BP 141/84 | HR 78 | Ht 71.0 in | Wt 330.0 lb

## 2022-04-26 DIAGNOSIS — G4733 Obstructive sleep apnea (adult) (pediatric): Secondary | ICD-10-CM

## 2022-05-01 ENCOUNTER — Telehealth: Payer: Self-pay

## 2022-05-22 ENCOUNTER — Telehealth: Admitting: Nurse Practitioner

## 2022-05-22 DIAGNOSIS — J069 Acute upper respiratory infection, unspecified: Secondary | ICD-10-CM

## 2022-05-22 MED ORDER — BENZONATATE 100 MG PO CAPS: 100.0000 mg | ORAL_CAPSULE | Freq: Three times a day (TID) | ORAL | 0 refills | Status: DC | PRN

## 2022-05-22 NOTE — Progress Notes (Signed)
E-Visit for Upper Respiratory Infection   We are sorry you are not feeling well.  Here is how we plan to help!  Based on what you have shared with me, it looks like you may have a viral upper respiratory infection.  Upper respiratory infections are caused by a large number of viruses; however, rhinovirus is the most common cause.   Symptoms vary from person to person, with common symptoms including sore throat, cough, fatigue or lack of energy and feeling of general discomfort.  A low-grade fever of up to 100.4 may present, but is often uncommon.  Symptoms vary however, and are closely related to a person's age or underlying illnesses.  The most common symptoms associated with an upper respiratory infection are nasal discharge or congestion, cough, sneezing, headache and pressure in the ears and face.  These symptoms usually persist for about 3 to 10 days, but can last up to 2 weeks.  It is important to know that upper respiratory infections do not cause serious illness or complications in most cases.    Upper respiratory infections can be transmitted from person to person, with the most common method of transmission being a person's hands.  The virus is able to live on the skin and can infect other persons for up to 2 hours after direct contact.  Also, these can be transmitted when someone coughs or sneezes; thus, it is important to cover the mouth to reduce this risk.  To keep the spread of the illness at bay, good hand hygiene is very important.  This is an infection that is most likely caused by a virus. There are no specific treatments other than to help you with the symptoms until the infection runs its course.  We are sorry you are not feeling well.  Here is how we plan to help!   For nasal congestion, you may use an oral decongestants such as Coricidin HBP that is safe for people with high blood pressure    If you do not have a history of heart disease, hypertension, diabetes or thyroid  disease, prostate/bladder issues or glaucoma, you may also use Sudafed to treat nasal congestion.  It is highly recommended that you consult with a pharmacist or your primary care physician to ensure this medication is safe for you to take.     If you have a cough, you may use cough suppressants such as Delsym and Robitussin.  If you have glaucoma or high blood pressure, you can also use Coricidin HBP.   For cough I have prescribed for you A prescription cough medication called Tessalon Perles 100 mg. You may take 1-2 capsules every 8 hours as needed for cough  If you have a sore or scratchy throat, use a saltwater gargle-  to  teaspoon of salt dissolved in a 4-ounce to 8-ounce glass of warm water.  Gargle the solution for approximately 15-30 seconds and then spit.  It is important not to swallow the solution.  You can also use throat lozenges/cough drops and Chloraseptic spray to help with throat pain or discomfort.  Warm or cold liquids can also be helpful in relieving throat pain.  For headache, pain or general discomfort, you can use Ibuprofen or Tylenol as directed.   Some authorities believe that zinc sprays or the use of Echinacea may shorten the course of your symptoms.   HOME CARE Only take medications as instructed by your medical team. Be sure to drink plenty of fluids. Water is fine as  well as fruit juices, sodas and electrolyte beverages. You may want to stay away from caffeine or alcohol. If you are nauseated, try taking small sips of liquids. How do you know if you are getting enough fluid? Your urine should be a pale yellow or almost colorless. Get rest. Taking a steamy shower or using a humidifier may help nasal congestion and ease sore throat pain. You can place a towel over your head and breathe in the steam from hot water coming from a faucet. Using a saline nasal spray works much the same way. Cough drops, hard candies and sore throat lozenges may ease your cough. Avoid close  contacts especially the very young and the elderly Cover your mouth if you cough or sneeze Always remember to wash your hands.   GET HELP RIGHT AWAY IF: You develop worsening fever. If your symptoms do not improve within 10 days You develop yellow or green discharge from your nose over 3 days. You have coughing fits You develop a severe head ache or visual changes. You develop shortness of breath, difficulty breathing or start having chest pain Your symptoms persist after you have completed your treatment plan  MAKE SURE YOU  Understand these instructions. Will watch your condition. Will get help right away if you are not doing well or get worse.  Thank you for choosing an e-visit.  Your e-visit answers were reviewed by a board certified advanced clinical practitioner to complete your personal care plan. Depending upon the condition, your plan could have included both over the counter or prescription medications.  Please review your pharmacy choice. Make sure the pharmacy is open so you can pick up prescription now. If there is a problem, you may contact your provider through [employer] and have the prescription routed to another pharmacy.  Your safety is important to [place]. If you have drug allergies check your prescription carefully.   For the next 24 hours you can use MyChart to ask questions about today's visit, request a non-urgent call back, or ask for a work or school excuse. You will get an email in the next two days asking about your experience. I hope that your e-visit has been valuable and will speed your recovery.  Meds ordered this encounter  Medications   benzonatate (TESSALON) 100 MG capsule    Sig: Take 1 capsule (100 mg total) by mouth 3 (three) times daily as needed.    Dispense:  30 capsule    Refill:  0    I spent approximately 5 minutes reviewing the patient's history, current symptoms and coordinating their care today.

## 2022-07-19 ENCOUNTER — Other Ambulatory Visit: Payer: Self-pay | Admitting: Family Medicine

## 2022-07-19 DIAGNOSIS — E7800 Pure hypercholesterolemia, unspecified: Secondary | ICD-10-CM

## 2022-07-19 NOTE — Telephone Encounter (Signed)
Requested Prescriptions  Pending Prescriptions Disp Refills   rosuvastatin (CRESTOR) 10 MG tablet [Pharmacy Med Name: ROSUVASTATIN 10MG  TABLETS] 90 tablet 1    Sig: TAKE 1 TABLET(10 MG) BY MOUTH DAILY     Cardiovascular:  Antilipid - Statins 2 Failed - 07/19/2022  6:29 AM      Failed - Valid encounter within last 12 months    Recent Outpatient Visits           1 year ago General medical exam   [facility] Donita Brooks, MD   2 years ago Elevated blood sugar   [facility] Pickard, Priscille Heidelberg, MD   4 years ago General medical exam   [facility] Donita Brooks, MD   5 years ago General medical exam   [facility] Donita Brooks, MD   6 years ago Situational anxiety   [facility] Tanya Nones, Priscille Heidelberg, MD       Future Appointments             In 9 months Ihor Austin, NP [place] [facility]            Failed - Lipid Panel in normal range within the last 12 months    Cholesterol  Date Value Ref Range Status  01/19/2022 223 (H) <200 mg/dL Final   LDL Cholesterol (Calc)  Date Value Ref Range Status  01/19/2022 142 (H) mg/dL (calc) Final    Comment:    Reference range: <100 . Desirable range <100 mg/dL for primary prevention;   <70 mg/dL for patients with CHD or diabetic patients  with > or = 2 CHD risk factors. Marland Kitchen LDL-C is now calculated using the Martin-Hopkins  calculation, which is a validated novel method providing  better accuracy than the Friedewald equation in the  estimation of LDL-C.  Horald Pollen et al. Lenox Ahr. 1610;960(45): 2061-2068  (http://education.[facility].com/faq/FAQ164)    HDL  Date Value Ref Range Status  01/19/2022 54 > OR = 40 mg/dL Final   Triglycerides  Date Value Ref Range Status  01/19/2022 142 <150 mg/dL Final         Passed - Cr in normal range and within 360 days    Creat  Date Value Ref Range Status  01/19/2022  1.15 0.70 - 1.35 mg/dL Final         Passed - Patient is not pregnant

## 2022-07-23 ENCOUNTER — Other Ambulatory Visit: Payer: Self-pay | Admitting: Family Medicine

## 2022-09-19 ENCOUNTER — Telehealth: Admitting: Emergency Medicine

## 2022-09-19 DIAGNOSIS — H60501 Unspecified acute noninfective otitis externa, right ear: Secondary | ICD-10-CM

## 2022-09-19 MED ORDER — NEOMYCIN-POLYMYXIN-HC 3.5-10000-1 OT SOLN: 4.0000 [drp] | Freq: Four times a day (QID) | OTIC | 0 refills | Status: DC

## 2022-09-19 NOTE — Progress Notes (Signed)
E Visit for Ear Pain - Swimmer's Ear  We are sorry that you are not feeling well. Here is how we plan to help!  Based on what you have shared with me it looks like you have Swimmer's Ear.  Swimmer's ear is a redness or swelling, irritation, or infection of your outer ear canal. These symptoms usually occur within a few days of swimming. Your ear canal is a tube that goes from the opening of the ear to the eardrum.  When water stays in your ear canal, germs can grow.  This is a painful condition that often happens to children and swimmers of all ages.  It is not contagious and oral antibiotics are not required to treat uncomplicated swimmer's ear.  The usual symptoms include:    Itchiness inside the ear  Redness or a sense of swelling in the ear  Pain when the ear is tugged on when pressure is placed on the ear  Pus draining from the infected ear   I have prescribed: Neomycin 0.35%, polymyxin B 10,000 units/mL, and hydrocortisone 0,5% otic solution 4 drops in affected ears four times a day for 7 days  In certain cases, swimmer's ear may progress to a more serious bacterial infection of the middle or inner ear.  If you have a fever 102 and up and significantly worsening symptoms, this could indicate a more serious infection moving to the middle/inner and needs face to face evaluation in an office by a provider.  Your symptoms should improve over the next 3 days and should resolve in about 7 days.  Be sure to complete ALL of your prescription.  HOME CARE: Wash your hands frequently. If you are prescribed an ear drop, do not place the tip of the bottle on your ear or touch it with your fingers. You can take Acetaminophen 650 mg every 4-6 hours as needed for pain.  If pain is severe or moderate, you can apply a heating pad (set on low) or hot water bottle (wrapped in a towel) to outer ear for 20 minutes.  This will also increase drainage. Avoid ear plugs Do not go swimming until the symptoms are  gone Do not use Q-tips After showers, help the water run out by tilting your head to one side.   GET HELP RIGHT AWAY IF: Fever is over 102.2 degrees. You develop progressive ear pain or hearing loss. Ear symptoms persist longer than 3 days after treatment.  MAKE SURE YOU: Understand these instructions. Will watch your condition. Will get help right away if you are not doing well or get worse.  TO PREVENT SWIMMER'S EAR: Use a bathing cap or custom fitted swim molds to keep your ears dry. Towel off after swimming to dry your ears. Tilt your head or pull your earlobes to allow the water to escape your ear canal. If there is still water in your ears, consider using a hairdryer on the lowest setting.  Thank you for choosing an e-visit.  Your e-visit answers were reviewed by a board certified advanced clinical practitioner to complete your personal care plan. Depending upon the condition, your plan could have included both over the counter or prescription medications.  Please review your pharmacy choice. Make sure the pharmacy is open so you can pick up the prescription now. If there is a problem, you may contact your provider through [employer] and have the prescription routed to another pharmacy.  Your safety is important to [place]. If you have drug allergies  check your prescription carefully.   For the next 24 hours you can use MyChart to ask questions about today's visit, request a non-urgent call back, or ask for a work or school excuse. You will get an email with a survey after your eVisit asking about your experience. We would appreciate your feedback. I hope that your e-visit has been valuable and will aid in your recovery.  I have spent 5 minutes in review of e-visit questionnaire, review and updating patient chart, medical decision making and response to patient.   Rica Mast, PhD, FNP-BC

## 2023-04-15 ENCOUNTER — Other Ambulatory Visit: Payer: Self-pay | Admitting: Family Medicine

## 2023-04-15 DIAGNOSIS — E7800 Pure hypercholesterolemia, unspecified: Secondary | ICD-10-CM

## 2023-04-16 NOTE — Telephone Encounter (Signed)
 Requested medications are due for refill today.  yes  Requested medications are on the active medications list.  yes  Last refill. Statin 06/30/2022 #90 1 rf, Valsartan 01/19/2022 #90 3 rf  Future visit scheduled.   no  Notes to clinic.  Pt is more than 3 months overdue for a CPE.    Requested Prescriptions  Pending Prescriptions Disp Refills   valsartan (DIOVAN) 160 MG tablet [Pharmacy Med Name: VALSARTAN 160MG  TABLETS] 90 tablet 3    Sig: TAKE 1 TABLET(160 MG) BY MOUTH DAILY     Cardiovascular:  Angiotensin Receptor Blockers Failed - 04/16/2023  1:55 PM      Failed - Cr in normal range and within 180 days    Creat  Date Value Ref Range Status  01/19/2022 1.15 0.70 - 1.35 mg/dL Final         Failed - K in normal range and within 180 days    Potassium  Date Value Ref Range Status  01/19/2022 4.9 3.5 - 5.3 mmol/L Final         Failed - Last BP in normal range    BP Readings from Last 1 Encounters:  04/26/22 (!) 141/84         Failed - Valid encounter within last 6 months    Recent Outpatient Visits           1 year ago General medical exam   [facility] Donita Brooks, MD   3 years ago Elevated blood sugar   [facility] Pickard, Priscille Heidelberg, MD   5 years ago General medical exam   [facility] Donita Brooks, MD   6 years ago General medical exam   [facility] Donita Brooks, MD   7 years ago Situational anxiety   [facility] Pickard, Priscille Heidelberg, MD       Future Appointments             In 1 week Ihor Austin, NP [place] [facility]            Passed - Patient is not pregnant       rosuvastatin (CRESTOR) 10 MG tablet [Pharmacy Med Name: ROSUVASTATIN 10MG  TABLETS] 90 tablet 1    Sig: TAKE 1 TABLET(10 MG) BY MOUTH DAILY     Cardiovascular:  Antilipid - Statins 2 Failed - 04/16/2023  1:55 PM      Failed - Cr in normal range and within 360 days     Creat  Date Value Ref Range Status  01/19/2022 1.15 0.70 - 1.35 mg/dL Final         Failed - Valid encounter within last 12 months    Recent Outpatient Visits           1 year ago General medical exam   [facility] Donita Brooks, MD   3 years ago Elevated blood sugar   [facility] Tanya Nones, Priscille Heidelberg, MD   5 years ago General medical exam   [facility] Donita Brooks, MD   6 years ago General medical exam   [facility] Donita Brooks, MD   7 years ago Situational anxiety   [facility] Pickard, Priscille Heidelberg, MD       Future Appointments             In 1 week Ihor Austin, NP [facility]            Failed - Lipid Panel in normal range within the last 12 months    Cholesterol  Date Value Ref Range Status  01/19/2022 223 (H) <200 mg/dL Final   LDL Cholesterol (Calc)  Date Value Ref Range Status  01/19/2022 142 (H) mg/dL (calc) Final    Comment:    Reference range: <100 . Desirable range <100 mg/dL for primary prevention;   <70 mg/dL for patients with CHD or diabetic patients  with > or = 2 CHD risk factors. Marland Kitchen LDL-C is now calculated using the Martin-Hopkins  calculation, which is a validated novel method providing  better accuracy than the Friedewald equation in the  estimation of LDL-C.  Horald Pollen et al. Lenox Ahr. 1610;960(45): 2061-2068  (http://education.[facility].com/faq/FAQ164)    HDL  Date Value Ref Range Status  01/19/2022 54 > OR = 40 mg/dL Final   Triglycerides  Date Value Ref Range Status  01/19/2022 142 <150 mg/dL Final         Passed - Patient is not pregnant

## 2023-04-24 NOTE — Progress Notes (Deleted)
 [facility] [address] [place]. [place] [address] [phone]       OFFICE FOLLOW UP NOTE  [name] Date of Birth:  02/28/1960 Medical Record Number:  [id number]   Reason for visit: CPAP follow-up  Virtual Visit via Video Note  I connected with Willie Coleman on 04/25/23 at  2:45 PM EDT by a video enabled telemedicine application and verified that I am speaking with the correct person using two identifiers.  Location: Patient: *** Provider: in office, [facility]   I discussed the limitations of evaluation and management by telemedicine and the availability of in person appointments. The patient expressed understanding and agreed to proceed.    SUBJECTIVE:   Follow-up visit:  Prior visit: 04/26/2022   Brief HPI:   Willie Coleman is a 63 y.o. male who is being followed for OSA on CPAP.  Initially seen by Dr. Frances Furbish 06/08/2021 for concern of underlying sleep apnea with complaints of snoring, excessive daytime somnolence and nonrestorative sleep. Completed HST 6/7 which showed severe OSA with total AHI of 49.9/h and O2 nadir of 81%. Initiated AutoPap on 7/11.   Interval history:  Past 30-day compliance report shows excellent usage and optimal residual AHI.  Continues to tolerate CPAP well.  Continues to get great benefit with nightly use.  Routinely follows with DME company [employer] and up to date on supplies.  No questions or concerns today. Epworth sleepiness scale 4/25.            ROS:   14 system review of systems performed and negative with exception of those listed in HPI  PMH:  Past Medical History:  Diagnosis Date   Hyperlipidemia    Hypertension    Sleep apnea    auto PAP    PSH:  Past Surgical History:  Procedure Laterality Date   COLONOSCOPY N/A 08/29/2016   Procedure: COLONOSCOPY;  Surgeon: Malissa Hippo, MD;  Location: [facility];  Service: Endoscopy;  Laterality: N/A;  830   HERNIA REPAIR     inguinal    HYDROCELE EXCISION Left 06/14/2021   Procedure: HYDROCELECTOMY ADULT;  Surgeon: Milderd Meager., MD;  Location: [facility];  Service: Urology;  Laterality: Left;   TONSILLECTOMY      Social History:  Social History   Socioeconomic History   Marital status: Divorced    Spouse name: Not on file   Number of children: Not on file   Years of education: Not on file   Highest education level: Not on file  Occupational History   Not on file  Tobacco Use   Smoking status: Former    Current packs/day: 0.00    Types: Cigarettes    Start date: 09/20/1982    Quit date: 09/19/2012    Years since quitting: 10.6   Smokeless tobacco: Never  Vaping Use   Vaping status: Never Used  Substance and Sexual Activity   Alcohol use: Yes    Comment: 2 drinks per week   Drug use: No   Sexual activity: Not on file    Comment: adopted  Other Topics Concern   Not on file  Social History Narrative   Not on file   Social Drivers of Health   Financial Resource Strain: Not on file  Food Insecurity: Not on file  Transportation Needs: Not on file  Physical Activity: Not on file  Stress: Not on file  Social Connections: Not on file  Intimate Partner Violence: Not on file    Family  History:  Family History  Adopted: Yes  Problem Relation Age of Onset   Sleep apnea Neg Hx     Medications:   Current Outpatient Medications on File Prior to Visit  Medication Sig Dispense Refill   amLODipine (NORVASC) 10 MG tablet TAKE 1 TABLET BY MOUTH DAILY 90 tablet 1   benzonatate (TESSALON) 100 MG capsule Take 1 capsule (100 mg total) by mouth 3 (three) times daily as needed. 30 capsule 0   Multiple Vitamin (MULTIVITAMIN WITH MINERALS) TABS tablet Take 1 tablet by mouth daily.     neomycin-polymyxin-hydrocortisone (CORTISPORIN) OTIC solution Place 4 drops into the right ear 4 (four) times daily. 10 mL 0   rosuvastatin (CRESTOR) 10 MG tablet TAKE 1 TABLET(10 MG) BY MOUTH DAILY 90 tablet 1   valsartan (DIOVAN)  160 MG tablet Take 1 tablet (160 mg total) by mouth daily. 90 tablet 3   No current facility-administered medications on file prior to visit.    Allergies:  No Known Allergies    OBJECTIVE:  Physical Exam  There were no vitals filed for this visit.  There is no height or weight on file to calculate BMI. No results found.  General: well developed, well nourished, middle-aged Caucasian male, seated, in no evident distress Head: head normocephalic and atraumatic.   Neck: supple with no carotid or supraclavicular bruits Cardiovascular: regular rate and rhythm, no murmurs Musculoskeletal: no deformity Skin:  no rash/petichiae Vascular:  Normal pulses all extremities   Neurologic Exam Mental Status: Awake and fully alert. Oriented to place and time. Recent and remote memory intact. Attention span, concentration and fund of knowledge appropriate. Mood and affect appropriate.  Cranial Nerves: Pupils equal, briskly reactive to light. Extraocular movements full without nystagmus. Visual fields full to confrontation. Hearing intact. Facial sensation intact. Face, tongue, palate moves normally and symmetrically.  Motor: Normal bulk and tone. Normal strength in all tested extremity muscles Sensory.: intact to touch , pinprick , position and vibratory sensation.  Coordination: Rapid alternating movements normal in all extremities. Finger-to-nose and heel-to-shin performed accurately bilaterally. Gait and Station: Arises from chair without difficulty. Stance is normal. Gait demonstrates normal stride length and balance without use of AD. Reflexes: 1+ and symmetric. Toes downgoing.         ASSESSMENT/PLAN: Willie Coleman is a 63 y.o. year old male     OSA on CPAP : Compliance report shows satisfactory usage with optimal residual AHI.  Continue current pressure settings of 6-14 with EPR 2.  Discussed continued nightly usage with ensuring greater than 4 hours nightly for optimal benefit and  per insurance purposes.  Continue to follow with DME company for any needed supplies or CPAP related concerns     Follow up in 1 year or call earlier if needed   CC:  PCP: Donita Brooks, MD    I spent 16 minutes of face-to-face and non-face-to-face time with patient.  This included previsit chart review, lab review, study review, order entry, electronic health record documentation, patient education regarding sleep apnea with review and discussion of compliance report and answered all other questions to patient's satisfaction   Ihor Austin, [facility]  [facility] [address] [place], [place] [address]  Phone [phone] Fax [phone] Note: This document was prepared with digital dictation and possible smart phrase technology. Any transcriptional errors that result from this process are unintentional.

## 2023-04-25 ENCOUNTER — Telehealth: Admitting: Adult Health

## 2023-05-13 ENCOUNTER — Telehealth: Admitting: Nurse Practitioner

## 2023-05-13 ENCOUNTER — Other Ambulatory Visit: Payer: Self-pay

## 2023-05-13 ENCOUNTER — Other Ambulatory Visit: Payer: Self-pay | Admitting: Emergency Medicine

## 2023-05-13 ENCOUNTER — Encounter: Payer: Self-pay | Admitting: Family Medicine

## 2023-05-13 DIAGNOSIS — H60391 Other infective otitis externa, right ear: Secondary | ICD-10-CM

## 2023-05-13 DIAGNOSIS — E7800 Pure hypercholesterolemia, unspecified: Secondary | ICD-10-CM

## 2023-05-13 DIAGNOSIS — I10 Essential (primary) hypertension: Secondary | ICD-10-CM

## 2023-05-13 MED ORDER — CIPROFLOXACIN-DEXAMETHASONE 0.3-0.1 % OT SUSP: 4.0000 [drp] | Freq: Two times a day (BID) | OTIC | 0 refills | Status: DC

## 2023-05-13 MED ORDER — VALSARTAN 160 MG PO TABS: 160.0000 mg | ORAL_TABLET | Freq: Every day | ORAL | 1 refills | Status: DC

## 2023-05-13 MED ORDER — ROSUVASTATIN CALCIUM 10 MG PO TABS: 10.0000 mg | ORAL_TABLET | Freq: Every day | ORAL | 1 refills | Status: AC

## 2023-05-13 MED ORDER — AMLODIPINE BESYLATE 10 MG PO TABS: 10.0000 mg | ORAL_TABLET | Freq: Every day | ORAL | 1 refills | Status: DC

## 2023-05-13 NOTE — Progress Notes (Signed)
 E Visit for Ear Pain - Swimmer's Ear  We are sorry that you are not feeling well. Here is how we plan to help!  Based on what you have shared with me it looks like you have Swimmer's Ear.  Swimmer's ear is a redness or swelling, irritation, or infection of your outer ear canal. These symptoms usually occur within a few days of swimming. Your ear canal is a tube that goes from the opening of the ear to the eardrum.  When water stays in your ear canal, germs can grow.  This is a painful condition that often happens to children and swimmers of all ages.  It is not contagious and oral antibiotics are not required to treat uncomplicated swimmer's ear.  The usual symptoms include:    Itchiness inside the ear  Redness or a sense of swelling in the ear  Pain when the ear is tugged on when pressure is placed on the ear  Pus draining from the infected ear    I have prescribed:  Meds ordered this encounter  Medications   ciprofloxacin-dexamethasone (CIPRODEX) OTIC suspension    Sig: Place 4 drops into the right ear 2 (two) times daily.    Dispense:  7.5 mL    Refill:  0     In certain cases, swimmer's ear may progress to a more serious bacterial infection of the middle or inner ear.  If you have a fever 102 and up and significantly worsening symptoms, this could indicate a more serious infection moving to the middle/inner and needs face to face evaluation in an office by a provider.  Your symptoms should improve over the next 3 days and should resolve in about 7 days.  Be sure to complete ALL of your prescription.  HOME CARE: Wash your hands frequently. If you are prescribed an ear drop, do not place the tip of the bottle on your ear or touch it with your fingers. You can take Acetaminophen 650 mg every 4-6 hours as needed for pain.  If pain is severe or moderate, you can apply a heating pad (set on low) or hot water bottle (wrapped in a towel) to outer ear for 20 minutes.  This will also increase  drainage. Avoid ear plugs Do not go swimming until the symptoms are gone Do not use Q-tips After showers, help the water run out by tilting your head to one side.   GET HELP RIGHT AWAY IF: Fever is over 102.2 degrees. You develop progressive ear pain or hearing loss. Ear symptoms persist longer than 3 days after treatment.  MAKE SURE YOU: Understand these instructions. Will watch your condition. Will get help right away if you are not doing well or get worse.  TO PREVENT SWIMMER'S EAR: Use a bathing cap or custom fitted swim molds to keep your ears dry. Towel off after swimming to dry your ears. Tilt your head or pull your earlobes to allow the water to escape your ear canal. If there is still water in your ears, consider using a hairdryer on the lowest setting.  Thank you for choosing an e-visit.  Your e-visit answers were reviewed by a board certified advanced clinical practitioner to complete your personal care plan. Depending upon the condition, your plan could have included both over the counter or prescription medications.  Please review your pharmacy choice. Make sure the pharmacy is open so you can pick up the prescription now. If there is a problem, you may contact your provider through  MyChart messaging and have the prescription routed to another pharmacy.  Your safety is important to us . If you have drug allergies check your prescription carefully.   For the next 24 hours you can use MyChart to ask questions about today's visit, request a non-urgent call back, or ask for a work or school excuse. You will get an email with a survey after your eVisit asking about your experience. We would appreciate your feedback. I hope that your e-visit has been valuable and will aid in your recovery.  I spent approximately 5 minutes reviewing the patient's history, current symptoms and coordinating their care today.

## 2023-05-17 ENCOUNTER — Encounter: Payer: Self-pay | Admitting: Family Medicine

## 2023-05-17 ENCOUNTER — Ambulatory Visit: Admitting: Family Medicine

## 2023-05-17 VITALS — BP 148/102 | HR 77 | Temp 98.0°F | Ht 71.0 in | Wt 346.6 lb

## 2023-05-17 DIAGNOSIS — R7303 Prediabetes: Secondary | ICD-10-CM

## 2023-05-17 DIAGNOSIS — F321 Major depressive disorder, single episode, moderate: Secondary | ICD-10-CM

## 2023-05-17 DIAGNOSIS — Z125 Encounter for screening for malignant neoplasm of prostate: Secondary | ICD-10-CM | POA: Diagnosis not present

## 2023-05-17 DIAGNOSIS — I10 Essential (primary) hypertension: Secondary | ICD-10-CM | POA: Diagnosis not present

## 2023-05-17 MED ORDER — ESCITALOPRAM OXALATE 10 MG PO TABS: 10.0000 mg | ORAL_TABLET | Freq: Every day | ORAL | 3 refills | Status: AC

## 2023-05-17 NOTE — Progress Notes (Signed)
 Subjective:    Patient ID: Willie Coleman, male    DOB: 1960/03/19, 63 y.o.   MRN: [id number]  HPI Has not been seen in quite some time.  He presents today for follow-up of his diabetes.  However his blood pressure is very high at 148/102.  His weight has ballooned to 346 pounds.  He admits that he is not active.  He is also drinking alcohol on a daily basis.  The patient appears to be extremely depressed.  He reports that he feels depressed.  He has no relationships here locally.  He is tends to isolate himself.  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  He also suffered financial hardship after the death of his father and mother.  He was having to help pay their bills.  All of his family lives out of state and he seldom sees them.  I believe he is self-medicating with food and alcohol.  Today he demonstrates psychomotor retardation.  He has a flat affect.  He seems extremely sad and withdrawn.  He is concerned primarily about his weight.  He has a history of prediabetes. Past Medical History:  Diagnosis Date  . Hyperlipidemia   . Hypertension   . Sleep apnea    auto PAP   Past Surgical History:  Procedure Laterality Date  . COLONOSCOPY N/A 08/29/2016   Procedure: COLONOSCOPY;  Surgeon: Ruby Corporal, MD;  Location[facility];  Service: Endoscopy;  Laterality: N/A;  830  . HERNIA REPAIR     inguinal  . HYDROCELE EXCISION Left 06/14/2021   Procedure: HYDROCELECTOMY ADULT;  Surgeon: Mellie Sprinkle., MD;  Location: [facility];  Service: Urology;  Laterality: Left;  . TONSILLECTOMY     Current Outpatient Medications on File Prior to Visit  Medication Sig Dispense Refill  . amLODipine  (NORVASC ) 10 MG tablet Take 1 tablet (10 mg total) by mouth daily. 30 tablet 1  . ciprofloxacin -dexamethasone  (CIPRODEX ) OTIC suspension Place 4 drops into the right ear 2 (two) times daily. 7.5 mL 0  . Multiple Vitamin (MULTIVITAMIN  WITH MINERALS) TABS tablet Take 1 tablet by mouth daily.    . rosuvastatin  (CRESTOR ) 10 MG tablet Take 1 tablet (10 mg total) by mouth daily. 30 tablet 1  . valsartan  (DIOVAN ) 160 MG tablet Take 1 tablet (160 mg total) by mouth daily. 30 tablet 1   No current facility-administered medications on file prior to visit.   No Known Allergies Social History   Socioeconomic History  . Marital status: Divorced    Spouse name: Not on file  . Number of children: Not on file  . Years of education: Not on file  . Highest education level: Associate degree: occupational, Scientist, product/process development, or vocational program  Occupational History  . Not on file  Tobacco Use  . Smoking status: Former    Current packs/day: 0.00    Types: Cigarettes    Start date: 09/20/1982    Quit date: 09/19/2012    Years since quitting: 10.6  . Smokeless tobacco: Never  Vaping Use  . Vaping status: Never Used  Substance and Sexual Activity  . Alcohol use: Yes    Comment: 2 drinks per week  . Drug use: No  . Sexual activity: Not on file    Comment: adopted  Other Topics Concern  . Not on file  Social History Narrative  . Not on file   Social Drivers of Health  Financial Resource Strain: Low Risk  (05/16/2023)   Overall Financial Resource Strain (CARDIA)   . Difficulty of Paying Living Expenses: Not hard at all  Food Insecurity: No Food Insecurity (05/16/2023)   Hunger Vital Sign   . Worried About Programme researcher, broadcasting/film/video in the Last Year: Never true   . Ran Out of Food in the Last Year: Never true  Transportation Needs: No Transportation Needs (05/16/2023)   PRAPARE - Transportation   . Lack of Transportation (Medical): No   . Lack of Transportation (Non-Medical): No  Physical Activity: Unknown (05/16/2023)   Exercise Vital Sign   . Days of Exercise per Week: 0 days   . Minutes of Exercise per Session: Not on file  Stress: Stress Concern Present (05/16/2023)   [employer] of Occupational Health - Occupational Stress  Questionnaire   . Feeling of Stress : To some extent  Social Connections: Socially Isolated (05/16/2023)   Social Connection and Isolation Panel [NHANES]   . Frequency of Communication with Friends and Family: Once a week   . Frequency of Social Gatherings with Friends and Family: Never   . Attends Religious Services: Never   . Active Member of Clubs or Organizations: No   . Attends Banker Meetings: Not on file   . Marital Status: Divorced  Catering manager Violence: Not on file      Review of Systems     Objective:   Physical Exam Vitals reviewed.  Constitutional:      Appearance: Normal appearance. He is obese.  Cardiovascular:     Rate and Rhythm: Normal rate and regular rhythm.     Heart sounds: Normal heart sounds. No murmur heard. Pulmonary:     Effort: Pulmonary effort is normal.     Breath sounds: Normal breath sounds. No wheezing or rales.  Neurological:     Mental Status: He is alert.          Assessment & Plan:   Prediabetes - Plan: CBC with Differential/Platelet, COMPLETE METABOLIC PANEL WITHOUT GFR, Lipid panel, Hemoglobin A1c  Morbidly obese (HCC)  Benign essential HTN  Prostate cancer screening - Plan: PSA  Depression, major, single episode, moderate (HCC) I feel the patient is dealing with severe depression.  Begin Lexapro  10 mg daily.  Recheck in 6 weeks.  Meanwhile obtain fasting lab work including a CBC CMP lipid panel A1c and screen for prostate cancer with a PSA.  If A1c is elevated, I would recommend trying Mounjaro to manage his diabetes and to achieve weight loss.  His renal function is normal, I plan to increase valsartan  to 320 mg a day to help manage his blood pressure.  Reassess in 6 weeks.

## 2023-05-18 LAB — CBC WITH DIFFERENTIAL/PLATELET
Absolute Lymphocytes: 1953 {cells}/uL (ref 850–3900)
Absolute Monocytes: 424 {cells}/uL (ref 200–950)
Basophils Absolute: 39 {cells}/uL (ref 0–200)
Basophils Relative: 0.7 %
Eosinophils Absolute: 242 {cells}/uL (ref 15–500)
Eosinophils Relative: 4.4 %
HCT: 46.3 % (ref 38.5–50.0)
Hemoglobin: 15.7 g/dL (ref 13.2–17.1)
MCH: 32.7 pg (ref 27.0–33.0)
MCHC: 33.9 g/dL (ref 32.0–36.0)
MCV: 96.5 fL (ref 80.0–100.0)
MPV: 10.1 fL (ref 7.5–12.5)
Monocytes Relative: 7.7 %
Neutro Abs: 2844 {cells}/uL (ref 1500–7800)
Neutrophils Relative %: 51.7 %
Platelets: 255 10*3/uL (ref 140–400)
RBC: 4.8 10*6/uL (ref 4.20–5.80)
RDW: 12.4 % (ref 11.0–15.0)
Total Lymphocyte: 35.5 %
WBC: 5.5 10*3/uL (ref 3.8–10.8)

## 2023-05-18 LAB — COMPLETE METABOLIC PANEL WITHOUT GFR
AG Ratio: 1.5 (calc) (ref 1.0–2.5)
ALT: 38 U/L (ref 9–46)
AST: 27 U/L (ref 10–35)
Albumin: 4.3 g/dL (ref 3.6–5.1)
Alkaline phosphatase (APISO): 75 U/L (ref 35–144)
BUN: 14 mg/dL (ref 7–25)
CO2: 25 mmol/L (ref 20–32)
Calcium: 9.5 mg/dL (ref 8.6–10.3)
Chloride: 102 mmol/L (ref 98–110)
Creat: 1.18 mg/dL (ref 0.70–1.35)
Globulin: 2.9 g/dL (ref 1.9–3.7)
Glucose, Bld: 135 mg/dL — ABNORMAL HIGH (ref 65–99)
Potassium: 4.5 mmol/L (ref 3.5–5.3)
Sodium: 140 mmol/L (ref 135–146)
Total Bilirubin: 0.6 mg/dL (ref 0.2–1.2)
Total Protein: 7.2 g/dL (ref 6.1–8.1)

## 2023-05-18 LAB — HEMOGLOBIN A1C
Hgb A1c MFr Bld: 6.5 % — ABNORMAL HIGH (ref <5.7)
Mean Plasma Glucose: 140 mg/dL
eAG (mmol/L): 7.7 mmol/L

## 2023-05-18 LAB — LIPID PANEL
Cholesterol: 217 mg/dL — ABNORMAL HIGH (ref <200)
HDL: 42 mg/dL (ref >=40)
LDL Cholesterol (Calc): 149 mg/dL — ABNORMAL HIGH
Non-HDL Cholesterol (Calc): 175 mg/dL — ABNORMAL HIGH (ref <130)
Total CHOL/HDL Ratio: 5.2 (calc) — ABNORMAL HIGH (ref <5.0)
Triglycerides: 140 mg/dL (ref <150)

## 2023-05-18 LAB — PSA: PSA: 1.84 ng/mL (ref <=4.00)

## 2023-05-20 ENCOUNTER — Other Ambulatory Visit: Payer: Self-pay

## 2023-05-20 MED ORDER — TIRZEPATIDE 2.5 MG/0.5ML ~~LOC~~ SOAJ: 2.5000 mg | SUBCUTANEOUS | 1 refills | Status: DC

## 2023-05-20 MED ORDER — VALSARTAN 320 MG PO TABS: 320.0000 mg | ORAL_TABLET | Freq: Every day | ORAL | 3 refills | Status: AC

## 2023-05-22 ENCOUNTER — Telehealth: Payer: Self-pay

## 2023-05-22 DIAGNOSIS — R030 Elevated blood-pressure reading, without diagnosis of hypertension: Secondary | ICD-10-CM | POA: Insufficient documentation

## 2023-05-22 DIAGNOSIS — E7439 Other disorders of intestinal carbohydrate absorption: Secondary | ICD-10-CM | POA: Insufficient documentation

## 2023-05-22 DIAGNOSIS — M25529 Pain in unspecified elbow: Secondary | ICD-10-CM | POA: Insufficient documentation

## 2023-05-22 NOTE — Telephone Encounter (Signed)
 Willie Coleman (Key: Abel Abelson)  [employer] is reviewing your PA request and will respond within 24 hours for Medicaid or up to 72 hours for non-Medicaid plans, based on the required timeframe determined by state or federal regulations. To check for an update later, open this request from your dashboard.

## 2023-05-23 ENCOUNTER — Encounter: Payer: Self-pay | Admitting: Family Medicine

## 2023-05-23 NOTE — Telephone Encounter (Signed)
 Fax received from [employer]. They will not cover pt's Mounjaro because he does not have a diagnosis of diabetes. Pt advised. Mjp,lpn

## 2023-05-27 DIAGNOSIS — E119 Type 2 diabetes mellitus without complications: Secondary | ICD-10-CM | POA: Insufficient documentation

## 2023-06-10 ENCOUNTER — Other Ambulatory Visit: Payer: Self-pay | Admitting: Family Medicine

## 2023-06-10 MED ORDER — MOUNJARO 2.5 MG/0.5ML ~~LOC~~ SOAJ: 2.5000 mg | SUBCUTANEOUS | 1 refills | Status: DC

## 2023-06-17 ENCOUNTER — Other Ambulatory Visit: Payer: Self-pay | Admitting: Family Medicine

## 2023-06-17 MED ORDER — ZEPBOUND 2.5 MG/0.5ML ~~LOC~~ SOLN
2.5000 mg | SUBCUTANEOUS | 3 refills | Status: DC
Start: 2023-06-17 — End: 2023-08-22

## 2023-07-17 IMAGING — CT CT CHEST LUNG CANCER SCREENING LOW DOSE W/O CM
1 series · 10 of 10 positions shown, 13 images · non-contrast
Comparison: 04/10/2018 screening chest CT.

CLINICAL DATA: 60-year-old asymptomatic male former smoker with 32
pack-year smoking history, quit smoking 11 years prior.



[ct lung segmentation data · axial · 0.90mm/px · z∈[-368,-368]mm · 10 of 359 frames shown]
[frame 1/359  mediastinal]
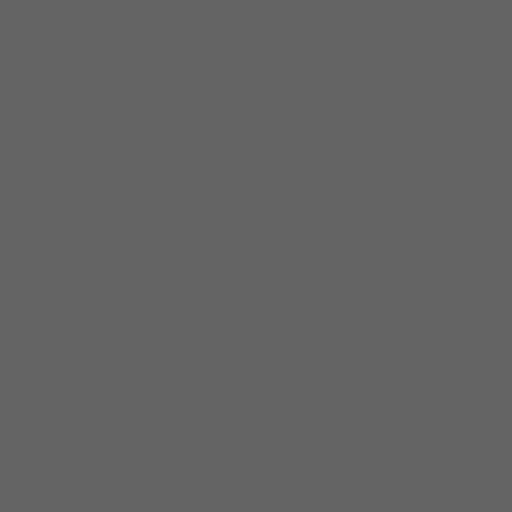
[frame 1/359  lung]
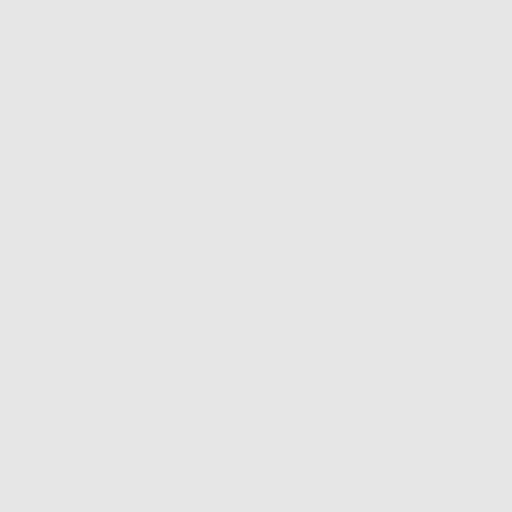
[frame 40/359  lung]
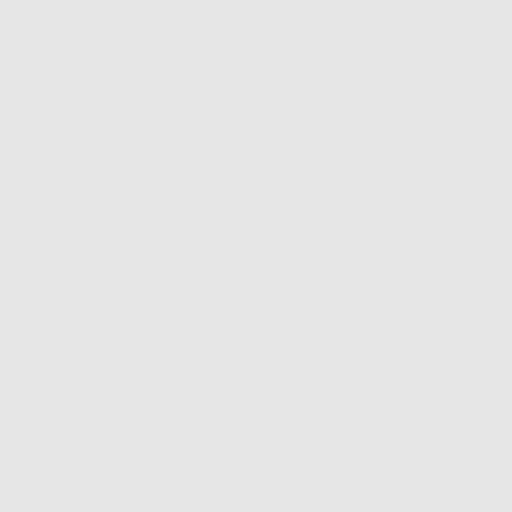
[frame 80/359  lung]
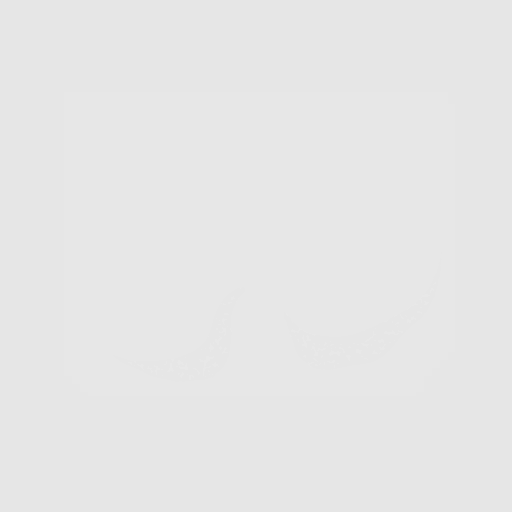
[frame 120/359  lung]
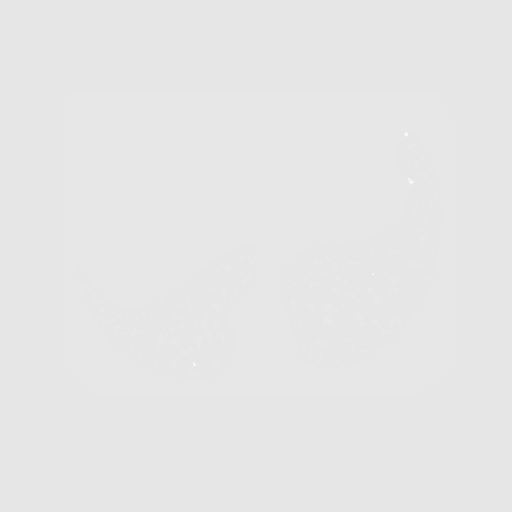
[frame 160/359  mediastinal]
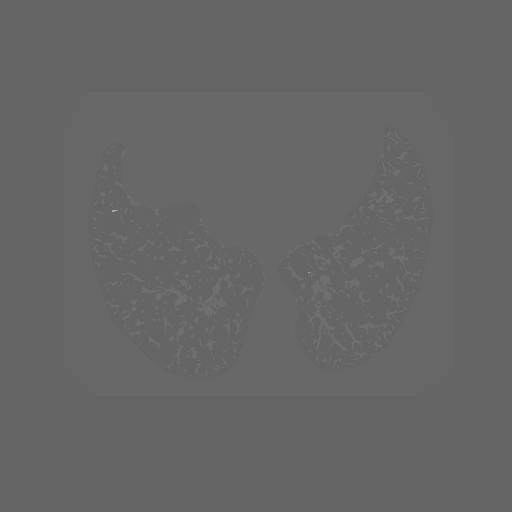
[frame 160/359  lung]
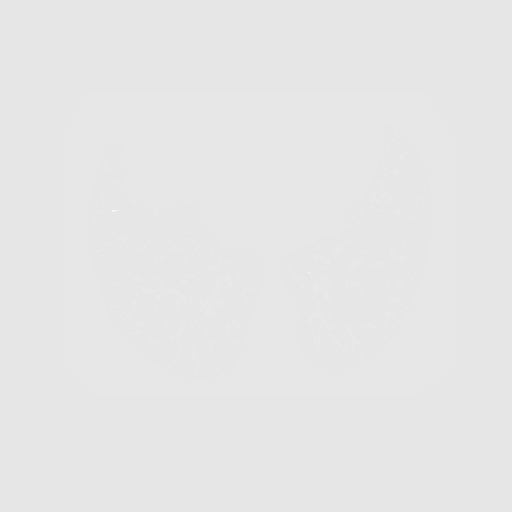
[frame 199/359  lung]
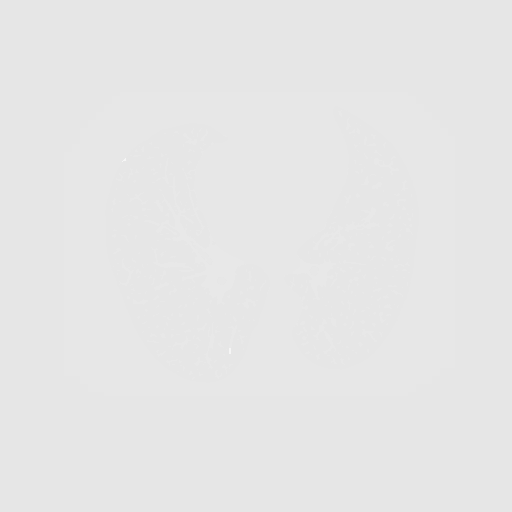
[frame 239/359  lung]
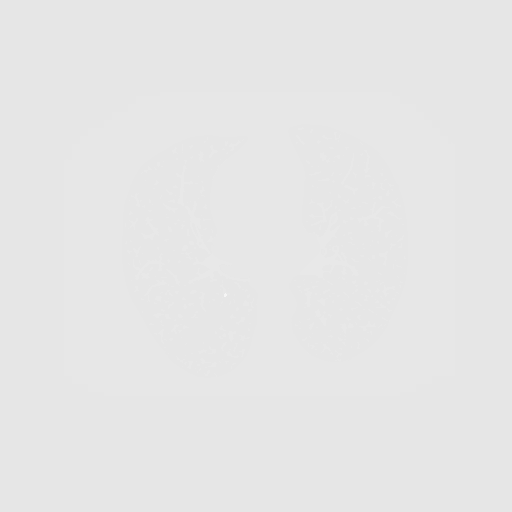
[frame 279/359  lung]
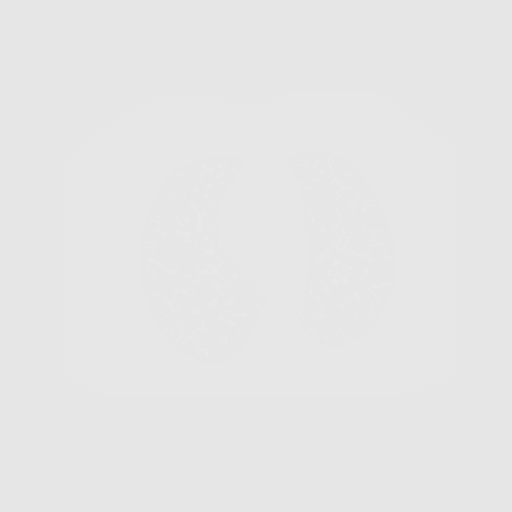
[frame 319/359  mediastinal]
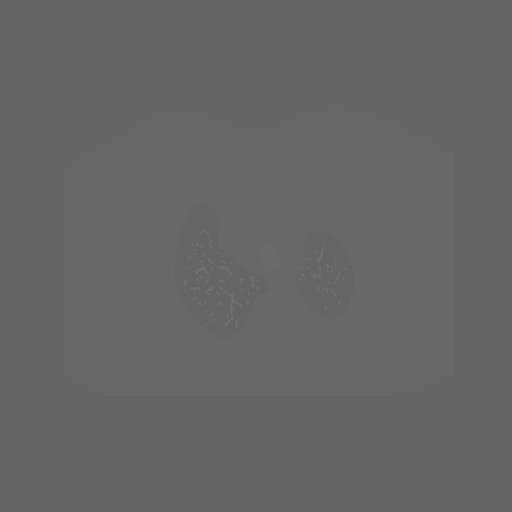
[frame 319/359  lung]
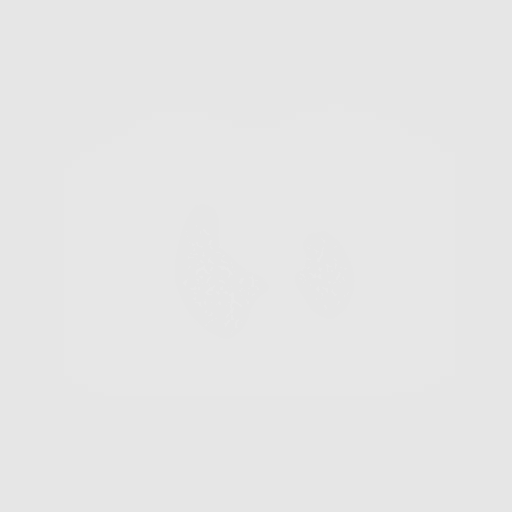
[frame 359/359  lung]
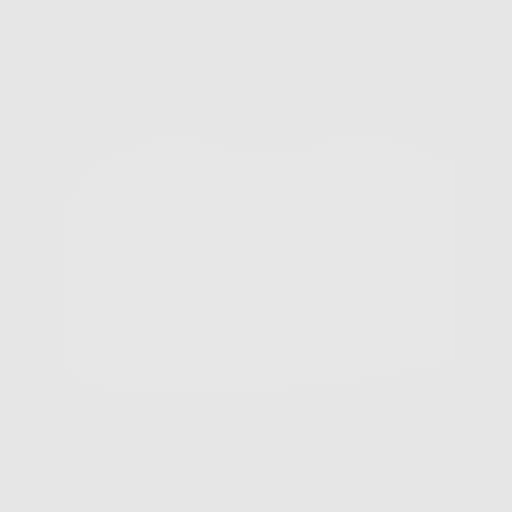

[10 of 10 positions shown; findings below may reference images not displayed]

FINDINGS: Cardiovascular: Normal heart size. No significant pericardial
effusion/thickening. Atherosclerotic nonaneurysmal thoracic aorta.
Normal caliber pulmonary arteries.

Mediastinum/Nodes: No discrete thyroid nodules. Unremarkable
esophagus. No pathologically enlarged axillary, mediastinal or hilar
lymph nodes, noting limited sensitivity for the detection of hilar
adenopathy on this noncontrast study.

Lungs/Pleura: No pneumothorax. No pleural effusion. Mild
centrilobular emphysema with diffuse bronchial wall thickening. No
acute consolidative airspace disease or lung masses. No significant
growth of previously visualized pulmonary nodules. No new
significant pulmonary nodules.

Upper abdomen: Diffuse hepatic steatosis.

Musculoskeletal: No aggressive appearing focal osseous lesions.
Minimal thoracic spondylosis.
IMPRESSION: Lung-RADS 2, benign appearance or behavior. Continue annual
screening with low-dose chest CT without contrast in 12 months.

Diffuse hepatic steatosis.

Aortic Atherosclerosis (YU7SE-4PU.U) and Emphysema (YU7SE-JH2.B).

## 2023-07-29 ENCOUNTER — Encounter: Payer: Self-pay | Admitting: Family Medicine

## 2023-07-29 ENCOUNTER — Other Ambulatory Visit: Payer: Self-pay

## 2023-07-29 MED ORDER — TIRZEPATIDE-WEIGHT MANAGEMENT 5 MG/0.5ML ~~LOC~~ SOLN: 5.0000 mg | SUBCUTANEOUS | 1 refills | Status: DC

## 2023-08-21 ENCOUNTER — Telehealth: Admitting: Family Medicine

## 2023-08-21 DIAGNOSIS — H60501 Unspecified acute noninfective otitis externa, right ear: Secondary | ICD-10-CM

## 2023-08-21 MED ORDER — CIPRO HC 0.2-1 % OT SUSP: 3.0000 [drp] | Freq: Two times a day (BID) | OTIC | 0 refills | Status: AC

## 2023-08-21 NOTE — Progress Notes (Signed)
 E Visit for Ear Pain - Swimmer's Ear  We are sorry that you are not feeling well. Here is how we plan to help!  Based on what you have shared with me it looks like you have Swimmer's Ear.  Swimmer's ear is a redness or swelling, irritation, or infection of your outer ear canal. These symptoms usually occur within a few days of swimming. Your ear canal is a tube that goes from the opening of the ear to the eardrum.  When water  stays in your ear canal, germs can grow.  This is a painful condition that often happens to children and swimmers of all ages.  It is not contagious and oral antibiotics are not required to treat uncomplicated swimmer's ear.  The usual symptoms include:    Itchiness inside the ear  Redness or a sense of swelling in the ear  Pain when the ear is tugged on when pressure is placed on the ear  Pus draining from the infected ear   I have prescribed: Ciprofloxin 0.2% and hydrocortisone 1% otic suspension 3 drops in affected ears twice daily for 7 days  In certain cases, swimmer's ear may progress to a more serious bacterial infection of the middle or inner ear.  If you have a fever 102 and up and significantly worsening symptoms, this could indicate a more serious infection moving to the middle/inner and needs face to face evaluation in an office by a provider.  Your symptoms should improve over the next 3 days and should resolve in about 7 days.  Be sure to complete ALL of your prescription.  HOME CARE: Wash your hands frequently. If you are prescribed an ear drop, do not place the tip of the bottle on your ear or touch it with your fingers. You can take Acetaminophen  650 mg every 4-6 hours as needed for pain.  If pain is severe or moderate, you can apply a heating pad (set on low) or hot water  bottle (wrapped in a towel) to outer ear for 20 minutes.  This will also increase drainage. Avoid ear plugs Do not go swimming until the symptoms are gone Do not use Q-tips After  showers, help the water  run out by tilting your head to one side.   GET HELP RIGHT AWAY IF: Fever is over 102.2 degrees. You develop progressive ear pain or hearing loss. Ear symptoms persist longer than 3 days after treatment.  MAKE SURE YOU: Understand these instructions. Will watch your condition. Will get help right away if you are not doing well or get worse.  TO PREVENT SWIMMER'S EAR: Use a bathing cap or custom fitted swim molds to keep your ears dry. Towel off after swimming to dry your ears. Tilt your head or pull your earlobes to allow the water  to escape your ear canal. If there is still water  in your ears, consider using a hairdryer on the lowest setting.  Thank you for choosing an e-visit.  Your e-visit answers were reviewed by a board certified advanced clinical practitioner to complete your personal care plan. Depending upon the condition, your plan could have included both over the counter or prescription medications.  Please review your pharmacy choice. Make sure the pharmacy is open so you can pick up the prescription now. If there is a problem, you may contact your provider through [employer] and have the prescription routed to another pharmacy.  Your safety is important to us . If you have drug allergies check your prescription carefully.  For the next 24 hours you can use MyChart to ask questions about today's visit, request a non-urgent call back, or ask for a work or school excuse. You will get an email with a survey after your eVisit asking about your experience. We would appreciate your feedback. I hope that your e-visit has been valuable and will aid in your recovery.   I have spent 5 minutes in review of e-visit questionnaire, review and updating patient chart, medical decision making and response to patient.   Olam DELENA Darby, FNP

## 2023-08-22 ENCOUNTER — Encounter: Payer: Self-pay | Admitting: Family Medicine

## 2023-08-22 ENCOUNTER — Other Ambulatory Visit: Payer: Self-pay

## 2023-08-22 MED ORDER — TIRZEPATIDE-WEIGHT MANAGEMENT 5 MG/0.5ML ~~LOC~~ SOLN: 5.0000 mg | SUBCUTANEOUS | 1 refills | Status: DC

## 2023-09-12 ENCOUNTER — Encounter: Payer: Self-pay | Admitting: Family Medicine

## 2023-09-12 ENCOUNTER — Other Ambulatory Visit: Payer: Self-pay

## 2023-09-12 MED ORDER — TIRZEPATIDE-WEIGHT MANAGEMENT 7.5 MG/0.5ML ~~LOC~~ SOLN: 7.5000 mg | SUBCUTANEOUS | 1 refills | Status: DC

## 2023-10-03 ENCOUNTER — Other Ambulatory Visit: Payer: Self-pay | Admitting: Family Medicine

## 2023-10-03 DIAGNOSIS — I10 Essential (primary) hypertension: Secondary | ICD-10-CM

## 2023-10-23 ENCOUNTER — Other Ambulatory Visit: Payer: Self-pay | Admitting: Family Medicine

## 2023-10-30 ENCOUNTER — Encounter: Payer: Self-pay | Admitting: Family Medicine

## 2023-10-31 ENCOUNTER — Other Ambulatory Visit: Payer: Self-pay | Admitting: Family Medicine

## 2023-10-31 MED ORDER — TIRZEPATIDE-WEIGHT MANAGEMENT 10 MG/0.5ML ~~LOC~~ SOLN: 10.0000 mg | SUBCUTANEOUS | 5 refills | Status: DC

## 2023-12-11 ENCOUNTER — Encounter: Payer: Self-pay | Admitting: Family Medicine

## 2023-12-11 ENCOUNTER — Other Ambulatory Visit: Payer: Self-pay

## 2023-12-11 MED ORDER — TIRZEPATIDE-WEIGHT MANAGEMENT 12.5 MG/0.5ML ~~LOC~~ SOLN: 12.5000 mg | SUBCUTANEOUS | 1 refills | Status: DC

## 2024-02-06 ENCOUNTER — Encounter: Payer: Self-pay | Admitting: Family Medicine

## 2024-02-07 ENCOUNTER — Other Ambulatory Visit: Payer: Self-pay

## 2024-02-07 MED ORDER — ZEPBOUND 15 MG/0.5ML ~~LOC~~ SOLN: 15.0000 mg | SUBCUTANEOUS | 1 refills | Status: AC

## 2024-03-04 ENCOUNTER — Other Ambulatory Visit: Payer: Self-pay | Admitting: Family Medicine

## 2024-03-04 DIAGNOSIS — I10 Essential (primary) hypertension: Secondary | ICD-10-CM
